# Patient Record
Sex: Female | Born: 1960 | Race: White | Hispanic: No | Marital: Single | Smoking: Current every day smoker
Health system: Southern US, Community
[De-identification: ages and names within clinical notes are randomized; demographics above are authoritative.]

## PROBLEM LIST (undated history)

## (undated) DIAGNOSIS — J449 Chronic obstructive pulmonary disease, unspecified: Secondary | ICD-10-CM

## (undated) DIAGNOSIS — R519 Headache, unspecified: Secondary | ICD-10-CM

## (undated) DIAGNOSIS — R51 Headache: Secondary | ICD-10-CM

## (undated) DIAGNOSIS — I10 Essential (primary) hypertension: Secondary | ICD-10-CM

## (undated) DIAGNOSIS — I219 Acute myocardial infarction, unspecified: Secondary | ICD-10-CM

## (undated) HISTORY — DX: Acute myocardial infarction, unspecified: I21.9

## (undated) HISTORY — PX: CARDIAC SURGERY: SHX584

## (undated) HISTORY — DX: Headache: R51

## (undated) HISTORY — DX: Chronic obstructive pulmonary disease, unspecified: J44.9

## (undated) HISTORY — DX: Essential (primary) hypertension: I10

## (undated) HISTORY — DX: Headache, unspecified: R51.9

---

## 2003-09-25 DIAGNOSIS — I219 Acute myocardial infarction, unspecified: Secondary | ICD-10-CM

## 2003-09-25 HISTORY — DX: Acute myocardial infarction, unspecified: I21.9

## 2004-08-17 ENCOUNTER — Other Ambulatory Visit: Payer: Self-pay

## 2004-08-17 ENCOUNTER — Inpatient Hospital Stay: Payer: Self-pay | Admitting: Cardiology

## 2004-08-18 ENCOUNTER — Other Ambulatory Visit: Payer: Self-pay

## 2004-08-19 ENCOUNTER — Other Ambulatory Visit: Payer: Self-pay

## 2006-09-24 HISTORY — PX: COSMETIC SURGERY: SHX468

## 2007-03-19 ENCOUNTER — Emergency Department: Payer: Self-pay | Admitting: Emergency Medicine

## 2010-03-21 ENCOUNTER — Emergency Department: Payer: Self-pay

## 2013-02-20 LAB — BASIC METABOLIC PANEL
Anion Gap: 6 — ABNORMAL LOW (ref 7–16)
BUN: 4 mg/dL — ABNORMAL LOW (ref 7–18)
Calcium, Total: 8.9 mg/dL (ref 8.5–10.1)
Chloride: 107 mmol/L (ref 98–107)
Co2: 23 mmol/L (ref 21–32)
Creatinine: 0.82 mg/dL (ref 0.60–1.30)
EGFR (African American): >60
Osmolality: 268 (ref 275–301)
Potassium: 3.4 mmol/L — ABNORMAL LOW (ref 3.5–5.1)
Sodium: 136 mmol/L (ref 136–145)

## 2013-02-20 LAB — CBC
HCT: 40.2 % (ref 35.0–47.0)
MCH: 30.6 pg (ref 26.0–34.0)
MCHC: 33.8 g/dL (ref 32.0–36.0)
Platelet: 290 10*3/uL (ref 150–440)
RDW: 13.8 % (ref 11.5–14.5)
WBC: 10 10*3/uL (ref 3.6–11.0)

## 2013-02-20 LAB — CK TOTAL AND CKMB (NOT AT ARMC)
CK, Total: 36 U/L (ref 21–215)
CK, Total: 42 U/L (ref 21–215)
CK, Total: 96 U/L (ref 21–215)
CK-MB: 1.2 ng/mL (ref 0.5–3.6)
CK-MB: <0.5 ng/mL — ABNORMAL LOW (ref 0.5–3.6)

## 2013-02-20 LAB — TROPONIN I: Troponin-I: 0.04 ng/mL

## 2013-02-21 ENCOUNTER — Inpatient Hospital Stay: Payer: Self-pay | Admitting: Internal Medicine

## 2013-02-21 LAB — BASIC METABOLIC PANEL
Anion Gap: 5 — ABNORMAL LOW (ref 7–16)
BUN: 3 mg/dL — ABNORMAL LOW (ref 7–18)
Calcium, Total: 8.6 mg/dL (ref 8.5–10.1)
Chloride: 112 mmol/L — ABNORMAL HIGH (ref 98–107)
Co2: 27 mmol/L (ref 21–32)
Creatinine: 0.6 mg/dL (ref 0.60–1.30)
Glucose: 90 mg/dL (ref 65–99)
Osmolality: 283 (ref 275–301)

## 2013-02-21 LAB — CK TOTAL AND CKMB (NOT AT ARMC): CK-MB: 4.8 ng/mL — ABNORMAL HIGH (ref 0.5–3.6)

## 2013-02-21 LAB — LIPID PANEL
Cholesterol: 167 mg/dL (ref 0–200)
Ldl Cholesterol, Calc: 112 mg/dL — ABNORMAL HIGH (ref 0–100)
VLDL Cholesterol, Calc: 28 mg/dL (ref 5–40)

## 2013-06-06 ENCOUNTER — Emergency Department: Payer: Self-pay | Admitting: Emergency Medicine

## 2015-01-14 NOTE — Discharge Summary (Signed)
PATIENT NAME:  Crystal [name], Crystal [name] MR#:  [id number] DATE OF BIRTH:  [birth year]  DATE OF ADMISSION:  02/20/2013 DATE OF DISCHARGE:  02/22/2013  DISCHARGE DIAGNOSES:   1.  Chest pain secondary to coronary artery disease, status post stenting. The patient had [name] drug-eluting stent performed on proximal third of saphenous vein graft from aorta to second obtuse marginal. Ejection fraction is around 49%.  2.  Hyperlipidemia. 3.  Acute bronchitis. 4.  Tobacco abuse.   CONSULTATION: Cardiology consult with Dr. Darrold JunkerParaschos.   HOSPITAL COURSE: This is [name] 54 year old female patient with tobacco abuse, history of coronary artery disease with CABG in 2005, came in because of:  1.  Chest pain. The patient was admitted to telemetry. The patient's troponins are followed, and EKG was monitored.  Initial vitals are stable. The patient was started on her home medications, aspirin and nitroglycerin. The patient is scheduled to have [name] stress test. Initial troponin was negative. An EKG showed initially no  ST-T changes. The patient continued to have chest pain which was relieved with nitroglycerin.  The patient's second set of troponins are elevated to 0.15.pt with coronary artery disease and CABG, I asked Cardiology to see the patient.  The patient had [name] cardiac cath on  May 30th which showed the patient had stenosis in the saphenous vein graft from aorta to second obtuse marginal around 95% stenosis, so [name] drug-eluting stent  was placed in there and following intervention 0% residual stenosis. The patient's also had proximal LAD 75% stenosis, mid LAD 90% stenosis and also circumflex had 100% stenosis.  Graft to mid LAD  graft was LIMA and graft angiography did not show any evidence of disease. The patient's EF is around 49%. The patient's troponin actually peaked up to 1.10, but after the procedure the patient did not have any chest pain.  She was monitored in the ICU for 24 hours have, and her LDL is around 112, so I started her  on statins.  The patient was hypotensive in the ICU because the patient received trazodone.  She told me that she has some hypotensive episodes with trazodone, and trazodone does not suit her; so, we stopped the trazodone, monitored in the ICU with fluids, and the blood pressure improved by late evening yesterday, and this morning blood pressure is 118/72, heart rate 91.  Yesterday because of low blood pressure her metoprolol and lisinopril were held. Discharge also was held because of hypotension.  Today, blood pressure is good. The patient can go home with beta blockers and ACE inhibitors and statins, aspirin and Plavix.  2.  Chronic obstructive pulmonary disease:  The patient continued to smoke 1 pack per day, has some cough, and the patient's chest x-ray was concerning for infiltrate versus  pneumonia on the left mid lung, so I started her on Rocephin and Zithromax , discharging her with Augmentin, ProAir and Spiriva.  I counseled her about  smoking cessation. The patient is stable for discharge, can follow with Dr. Darrold JunkerParaschos in 1 to 2 weeks.   DISCHARGE MEDICATIONS:  1.  Augmentin 1 tablet p.o. b.i.d. 875/125 for 10 days.  2.  Aspirin 81 mg daily.  3.  Plavix 75 mg p.o. daily. 4.  Lisinopril 5 mg 1/2 tablet daily.  5.  Metoprolol 25 mg tablet, 1 tablet p.o. b.i.d.  6.  Nitroglycerin sublingual as needed for chest pain p.r.n.  7.  Simvastatin 10 mg p.o. daily.  8.  Spiriva 18 mcg inhalation 1 spray daily.  The patient has no primary doctor, but she is advised to take medications and follow up with Dr. Darrold Junker.   TIME SPENT ON DISCHARGE PREPARATION: More than 30 minutes.    ____________________________ Katha Hamming, MD sk:cb D: 02/22/2013 08:07:02 ET T: 02/22/2013 16:10:16 ET JOB#: [id number]  cc: Katha Hamming, MD, <Dictator> Katha Hamming MD ELECTRONICALLY SIGNED 02/22/2013 20:26
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
## 2015-04-14 ENCOUNTER — Other Ambulatory Visit: Payer: Self-pay

## 2015-04-19 ENCOUNTER — Other Ambulatory Visit: Payer: Self-pay

## 2015-04-21 ENCOUNTER — Ambulatory Visit: Payer: Self-pay

## 2015-04-21 LAB — BASIC METABOLIC PANEL
BUN: 4 mg/dL (ref 4–21)
CREATININE: 0.7 mg/dL (ref 0.5–1.1)
Glucose: 104 mg/dL
Potassium: 5 mmol/L (ref 3.4–5.3)
SODIUM: 141 mmol/L (ref 137–147)

## 2015-04-21 LAB — LIPID PANEL
Cholesterol: 212 mg/dL — AB (ref 0–200)
HDL: 55 mg/dL (ref 35–70)
LDL Cholesterol: 128 mg/dL
Triglycerides: 147 mg/dL (ref 40–160)

## 2015-04-21 LAB — CBC AND DIFFERENTIAL
HEMATOCRIT: 42 % (ref 36–46)
Hemoglobin: 14.6 g/dL (ref 12.0–16.0)
Platelets: 342 10*3/uL (ref 150–399)
WBC: 9.1 10^3/mL

## 2015-04-21 LAB — HEPATIC FUNCTION PANEL
ALK PHOS: 55 U/L (ref 25–125)
ALT: 16 U/L (ref 7–35)
AST: 17 U/L (ref 13–35)
Bilirubin, Total: 0.3 mg/dL

## 2015-04-21 LAB — HEMOGLOBIN A1C: HEMOGLOBIN A1C: 5.5 % (ref 4.0–6.0)

## 2015-05-14 DIAGNOSIS — J441 Chronic obstructive pulmonary disease with (acute) exacerbation: Secondary | ICD-10-CM

## 2015-05-14 DIAGNOSIS — Z720 Tobacco use: Secondary | ICD-10-CM

## 2015-05-14 DIAGNOSIS — I10 Essential (primary) hypertension: Secondary | ICD-10-CM

## 2015-05-14 DIAGNOSIS — I25709 Atherosclerosis of coronary artery bypass graft(s), unspecified, with unspecified angina pectoris: Secondary | ICD-10-CM

## 2015-05-14 DIAGNOSIS — I25810 Atherosclerosis of coronary artery bypass graft(s) without angina pectoris: Secondary | ICD-10-CM | POA: Insufficient documentation

## 2015-05-24 ENCOUNTER — Ambulatory Visit: Payer: Self-pay

## 2015-11-10 ENCOUNTER — Ambulatory Visit: Payer: Self-pay

## 2015-11-10 DIAGNOSIS — J441 Chronic obstructive pulmonary disease with (acute) exacerbation: Secondary | ICD-10-CM | POA: Insufficient documentation

## 2015-11-10 DIAGNOSIS — I10 Essential (primary) hypertension: Secondary | ICD-10-CM | POA: Insufficient documentation

## 2015-11-30 ENCOUNTER — Ambulatory Visit: Payer: Self-pay | Admitting: Internal Medicine

## 2015-11-30 ENCOUNTER — Ambulatory Visit: Payer: Self-pay | Admitting: Ophthalmology

## 2018-07-02 ENCOUNTER — Ambulatory Visit: Payer: Self-pay

## 2018-09-10 ENCOUNTER — Ambulatory Visit: Payer: Self-pay

## 2019-01-07 ENCOUNTER — Ambulatory Visit: Payer: Self-pay

## 2019-12-05 ENCOUNTER — Inpatient Hospital Stay
Admission: EM | Admit: 2019-12-05 | Discharge: 2019-12-08 | DRG: 247 | Disposition: A | Discharge status: Home / Self Care | Payer: Self-pay | Attending: Internal Medicine | Admitting: Internal Medicine

## 2019-12-05 ENCOUNTER — Inpatient Hospital Stay
Admit: 2019-12-05 | Discharge: 2019-12-05 | Disposition: A | Discharge status: Home / Self Care | Payer: Self-pay | Attending: Family Medicine | Admitting: Family Medicine

## 2019-12-05 ENCOUNTER — Emergency Department: Payer: Self-pay

## 2019-12-05 ENCOUNTER — Other Ambulatory Visit: Payer: Self-pay

## 2019-12-05 ENCOUNTER — Encounter: Payer: Self-pay | Admitting: Emergency Medicine

## 2019-12-05 DIAGNOSIS — Z955 Presence of coronary angioplasty implant and graft: Secondary | ICD-10-CM

## 2019-12-05 DIAGNOSIS — I252 Old myocardial infarction: Secondary | ICD-10-CM

## 2019-12-05 DIAGNOSIS — Z951 Presence of aortocoronary bypass graft: Secondary | ICD-10-CM

## 2019-12-05 DIAGNOSIS — Z20822 Contact with and (suspected) exposure to covid-19: Secondary | ICD-10-CM | POA: Diagnosis present

## 2019-12-05 DIAGNOSIS — Z7902 Long term (current) use of antithrombotics/antiplatelets: Secondary | ICD-10-CM

## 2019-12-05 DIAGNOSIS — Z885 Allergy status to narcotic agent status: Secondary | ICD-10-CM

## 2019-12-05 DIAGNOSIS — Z801 Family history of malignant neoplasm of trachea, bronchus and lung: Secondary | ICD-10-CM

## 2019-12-05 DIAGNOSIS — Z7951 Long term (current) use of inhaled steroids: Secondary | ICD-10-CM

## 2019-12-05 DIAGNOSIS — J449 Chronic obstructive pulmonary disease, unspecified: Secondary | ICD-10-CM | POA: Diagnosis present

## 2019-12-05 DIAGNOSIS — E785 Hyperlipidemia, unspecified: Secondary | ICD-10-CM | POA: Diagnosis present

## 2019-12-05 DIAGNOSIS — I5022 Chronic systolic (congestive) heart failure: Secondary | ICD-10-CM | POA: Diagnosis present

## 2019-12-05 DIAGNOSIS — I2510 Atherosclerotic heart disease of native coronary artery without angina pectoris: Secondary | ICD-10-CM | POA: Diagnosis present

## 2019-12-05 DIAGNOSIS — Z7982 Long term (current) use of aspirin: Secondary | ICD-10-CM

## 2019-12-05 DIAGNOSIS — Z8249 Family history of ischemic heart disease and other diseases of the circulatory system: Secondary | ICD-10-CM

## 2019-12-05 DIAGNOSIS — I110 Hypertensive heart disease with heart failure: Secondary | ICD-10-CM | POA: Diagnosis present

## 2019-12-05 DIAGNOSIS — F17210 Nicotine dependence, cigarettes, uncomplicated: Secondary | ICD-10-CM | POA: Diagnosis present

## 2019-12-05 DIAGNOSIS — I10 Essential (primary) hypertension: Secondary | ICD-10-CM

## 2019-12-05 DIAGNOSIS — Z720 Tobacco use: Secondary | ICD-10-CM

## 2019-12-05 DIAGNOSIS — Z825 Family history of asthma and other chronic lower respiratory diseases: Secondary | ICD-10-CM

## 2019-12-05 DIAGNOSIS — I214 Non-ST elevation (NSTEMI) myocardial infarction: Principal | ICD-10-CM | POA: Diagnosis present

## 2019-12-05 DIAGNOSIS — Z79899 Other long term (current) drug therapy: Secondary | ICD-10-CM

## 2019-12-05 LAB — COMPREHENSIVE METABOLIC PANEL
ALT: 24 U/L (ref 0–44)
AST: 23 U/L (ref 15–41)
Albumin: 4.4 g/dL (ref 3.5–5.0)
Alkaline Phosphatase: 62 U/L (ref 38–126)
Anion gap: 10 (ref 5–15)
BUN: 7 mg/dL (ref 6–20)
CO2: 26 mmol/L (ref 22–32)
Calcium: 9.7 mg/dL (ref 8.9–10.3)
Chloride: 99 mmol/L (ref 98–111)
Creatinine, Ser: 0.67 mg/dL (ref 0.44–1.00)
GFR calc Af Amer: >60 mL/min (ref >60)
GFR calc non Af Amer: >60 mL/min (ref >60)
Glucose, Bld: 157 mg/dL — ABNORMAL HIGH (ref 70–99)
Potassium: 3.5 mmol/L (ref 3.5–5.1)
Sodium: 135 mmol/L (ref 135–145)
Total Bilirubin: 0.3 mg/dL (ref 0.3–1.2)
Total Protein: 7.7 g/dL (ref 6.5–8.1)

## 2019-12-05 LAB — APTT: aPTT: 26 seconds (ref 24–36)

## 2019-12-05 LAB — CBC WITH DIFFERENTIAL/PLATELET
Abs Immature Granulocytes: 0.02 10*3/uL (ref 0.00–0.07)
Basophils Absolute: 0.1 10*3/uL (ref 0.0–0.1)
Basophils Relative: 1 %
Eosinophils Absolute: 0.4 10*3/uL (ref 0.0–0.5)
Eosinophils Relative: 5 %
HCT: 38.9 % (ref 36.0–46.0)
Hemoglobin: 13.1 g/dL (ref 12.0–15.0)
Immature Granulocytes: 0 %
Lymphocytes Relative: 34 %
Lymphs Abs: 2.4 10*3/uL (ref 0.7–4.0)
MCH: 29.4 pg (ref 26.0–34.0)
MCHC: 33.7 g/dL (ref 30.0–36.0)
MCV: 87.4 fL (ref 80.0–100.0)
Monocytes Absolute: 0.5 10*3/uL (ref 0.1–1.0)
Monocytes Relative: 8 %
Neutro Abs: 3.7 10*3/uL (ref 1.7–7.7)
Neutrophils Relative %: 52 %
Platelets: 359 10*3/uL (ref 150–400)
RBC: 4.45 MIL/uL (ref 3.87–5.11)
RDW: 13.2 % (ref 11.5–15.5)
WBC: 7.1 10*3/uL (ref 4.0–10.5)
nRBC: 0 % (ref 0.0–0.2)

## 2019-12-05 LAB — LIPID PANEL
Cholesterol: 167 mg/dL (ref 0–200)
HDL: 43 mg/dL (ref >40)
LDL Cholesterol: 90 mg/dL (ref 0–99)
Total CHOL/HDL Ratio: 3.9 RATIO
Triglycerides: 171 mg/dL — ABNORMAL HIGH (ref <150)
VLDL: 34 mg/dL (ref 0–40)

## 2019-12-05 LAB — RESPIRATORY PANEL BY RT PCR (FLU A&B, COVID)
Influenza A by PCR: NEGATIVE
Influenza B by PCR: NEGATIVE
SARS Coronavirus 2 by RT PCR: NEGATIVE

## 2019-12-05 LAB — PROTIME-INR
INR: 0.9 (ref 0.8–1.2)
Prothrombin Time: 11.5 seconds (ref 11.4–15.2)

## 2019-12-05 LAB — HEPARIN LEVEL (UNFRACTIONATED)
Heparin Unfractionated: 1.15 IU/mL — ABNORMAL HIGH (ref 0.30–0.70)
Heparin Unfractionated: 0.51 IU/mL (ref 0.30–0.70)

## 2019-12-05 LAB — TROPONIN I (HIGH SENSITIVITY)
Troponin I (High Sensitivity): 110 ng/L (ref <18)
Troponin I (High Sensitivity): 200 ng/L (ref <18)
Troponin I (High Sensitivity): 3557 ng/L (ref <18)

## 2019-12-05 LAB — GLUCOSE, CAPILLARY: Glucose-Capillary: 124 mg/dL — ABNORMAL HIGH (ref 70–99)

## 2019-12-05 LAB — HIV ANTIBODY (ROUTINE TESTING W REFLEX): HIV Screen 4th Generation wRfx: NONREACTIVE

## 2019-12-05 MED ORDER — FENTANYL CITRATE (PF) 100 MCG/2ML IJ SOLN
50.0000 ug | Freq: Once | INTRAMUSCULAR | Status: AC
  Administered 2019-12-05: 50 ug via INTRAVENOUS
  Filled 2019-12-05: qty 2

## 2019-12-05 MED ORDER — PERFLUTREN LIPID MICROSPHERE
1.0000 mL | INTRAVENOUS | Status: AC | PRN
  Administered 2019-12-05: 3 mL via INTRAVENOUS
  Filled 2019-12-05: qty 10

## 2019-12-05 MED ORDER — ATORVASTATIN CALCIUM 20 MG PO TABS
40.0000 mg | ORAL_TABLET | Freq: Every day | ORAL | Status: DC
  Administered 2019-12-05 – 2019-12-07 (×3): 40 mg via ORAL
  Filled 2019-12-05 (×4): qty 2

## 2019-12-05 MED ORDER — ALPRAZOLAM 0.25 MG PO TABS: 0.2500 mg | ORAL_TABLET | Freq: Two times a day (BID) | ORAL | Status: DC | PRN

## 2019-12-05 MED ORDER — ONDANSETRON HCL 4 MG/2ML IJ SOLN: 4.0000 mg | Freq: Four times a day (QID) | INTRAMUSCULAR | Status: DC | PRN

## 2019-12-05 MED ORDER — LIDOCAINE VISCOUS HCL 2 % MT SOLN
15.0000 mL | Freq: Once | OROMUCOSAL | Status: AC
  Administered 2019-12-05: 15 mL via ORAL
  Filled 2019-12-05: qty 15

## 2019-12-05 MED ORDER — ASPIRIN EC 81 MG PO TBEC
81.0000 mg | DELAYED_RELEASE_TABLET | Freq: Every day | ORAL | Status: DC
  Administered 2019-12-05 – 2019-12-08 (×3): 81 mg via ORAL
  Filled 2019-12-05 (×3): qty 1

## 2019-12-05 MED ORDER — MORPHINE SULFATE (PF) 2 MG/ML IV SOLN
2.0000 mg | INTRAVENOUS | Status: DC | PRN
  Administered 2019-12-05 (×2): 2 mg via INTRAVENOUS
  Filled 2019-12-05 (×3): qty 1

## 2019-12-05 MED ORDER — ACETAMINOPHEN 325 MG PO TABS: 650.0000 mg | ORAL_TABLET | ORAL | Status: DC | PRN

## 2019-12-05 MED ORDER — IOHEXOL 350 MG/ML SOLN
75.0000 mL | Freq: Once | INTRAVENOUS | Status: AC | PRN
  Administered 2019-12-05: 75 mL via INTRAVENOUS

## 2019-12-05 MED ORDER — NITROGLYCERIN 0.4 MG SL SUBL
0.4000 mg | SUBLINGUAL_TABLET | SUBLINGUAL | Status: DC | PRN
  Administered 2019-12-05: 0.4 mg via SUBLINGUAL
  Filled 2019-12-05: qty 1

## 2019-12-05 MED ORDER — NITROGLYCERIN IN D5W 200-5 MCG/ML-% IV SOLN
0.0000 ug/min | INTRAVENOUS | Status: DC
  Administered 2019-12-05: 50 ug/min via INTRAVENOUS
  Filled 2019-12-05: qty 250

## 2019-12-05 MED ORDER — CHLORHEXIDINE GLUCONATE CLOTH 2 % EX PADS
6.0000 | MEDICATED_PAD | Freq: Every day | CUTANEOUS | Status: DC
  Administered 2019-12-05 – 2019-12-07 (×3): 6 via TOPICAL

## 2019-12-05 MED ORDER — POTASSIUM CHLORIDE 20 MEQ PO PACK
40.0000 meq | PACK | Freq: Once | ORAL | Status: AC
  Administered 2019-12-05: 40 meq via ORAL
  Filled 2019-12-05: qty 2

## 2019-12-05 MED ORDER — METOPROLOL TARTRATE 25 MG PO TABS: 12.5000 mg | ORAL_TABLET | Freq: Two times a day (BID) | ORAL | Status: DC

## 2019-12-05 MED ORDER — CLOPIDOGREL BISULFATE 75 MG PO TABS
75.0000 mg | ORAL_TABLET | Freq: Every day | ORAL | Status: DC
  Administered 2019-12-05 – 2019-12-06 (×2): 75 mg via ORAL
  Filled 2019-12-05 (×2): qty 1

## 2019-12-05 MED ORDER — SODIUM CHLORIDE 0.9 % IV BOLUS
1000.0000 mL | Freq: Once | INTRAVENOUS | Status: AC
  Administered 2019-12-05: 1000 mL via INTRAVENOUS

## 2019-12-05 MED ORDER — HEPARIN (PORCINE) 25000 UT/250ML-% IV SOLN
800.0000 [IU]/h | INTRAVENOUS | Status: DC
  Administered 2019-12-05: 800 [IU]/h via INTRAVENOUS
  Filled 2019-12-05: qty 250

## 2019-12-05 MED ORDER — METOPROLOL SUCCINATE ER 25 MG PO TB24
25.0000 mg | ORAL_TABLET | Freq: Two times a day (BID) | ORAL | Status: DC
  Administered 2019-12-05 – 2019-12-08 (×6): 25 mg via ORAL
  Filled 2019-12-05 (×7): qty 1

## 2019-12-05 MED ORDER — HEPARIN (PORCINE) 25000 UT/250ML-% IV SOLN
700.0000 [IU]/h | INTRAVENOUS | Status: DC
  Administered 2019-12-06: 700 [IU]/h via INTRAVENOUS
  Filled 2019-12-05: qty 250

## 2019-12-05 MED ORDER — ALUM & MAG HYDROXIDE-SIMETH 200-200-20 MG/5ML PO SUSP
30.0000 mL | Freq: Once | ORAL | Status: AC
  Administered 2019-12-05: 30 mL via ORAL
  Filled 2019-12-05: qty 30

## 2019-12-05 MED ORDER — HEPARIN BOLUS VIA INFUSION
3400.0000 [IU] | Freq: Once | INTRAVENOUS | Status: AC
  Administered 2019-12-05: 3400 [IU] via INTRAVENOUS
  Filled 2019-12-05: qty 3400

## 2019-12-05 MED ORDER — LISINOPRIL 5 MG PO TABS
2.5000 mg | ORAL_TABLET | Freq: Every day | ORAL | Status: DC
  Administered 2019-12-05 – 2019-12-08 (×3): 2.5 mg via ORAL
  Filled 2019-12-05 (×3): qty 1

## 2019-12-05 MED ORDER — ISOSORBIDE MONONITRATE ER 30 MG PO TB24
30.0000 mg | ORAL_TABLET | Freq: Every day | ORAL | Status: DC
  Administered 2019-12-05 – 2019-12-08 (×3): 30 mg via ORAL
  Filled 2019-12-05 (×3): qty 1

## 2019-12-05 MED ORDER — SODIUM CHLORIDE 0.9 % IV SOLN
INTRAVENOUS | Status: DC
  Administered 2019-12-06: 13:00:00 100 mL/h via INTRAVENOUS

## 2019-12-05 MED ORDER — ALBUTEROL SULFATE (2.5 MG/3ML) 0.083% IN NEBU: 2.5000 mg | INHALATION_SOLUTION | Freq: Four times a day (QID) | RESPIRATORY_TRACT | Status: DC | PRN

## 2019-12-05 MED ORDER — ZOLPIDEM TARTRATE 5 MG PO TABS: 5.0000 mg | ORAL_TABLET | Freq: Every evening | ORAL | Status: DC | PRN

## 2019-12-05 NOTE — Progress Notes (Signed)
ANTICOAGULATION CONSULT NOTE - Initial Consult  Pharmacy Consult for heparin Indication: chest pain/ACS  Allergies  Allergen Reactions  . Trazodone And Nefazodone Palpitations    Patient Measurements: Height: 5' (152.4 cm) Weight: 125 lb 7.1 oz (56.9 kg) IBW/kg (Calculated) : 45.5 Heparin Dosing Weight: 56.2 kg  Vital Signs: Temp: 97.8 F (36.6 C) (03/13 0746) Temp Source: Oral (03/13 0746) BP: 128/75 (03/13 1000) Pulse Rate: 105 (03/13 1023)  Labs: Recent Labs    12/05/19 0142 12/05/19 0329 12/05/19 1048  HGB 13.1  --   --   HCT 38.9  --   --   PLT 359  --   --   APTT 26  --   --   LABPROT 11.5  --   --   INR 0.9  --   --   HEPARINUNFRC  --   --  1.15*  CREATININE 0.67  --   --   TROPONINIHS 110* 200*  --     Estimated Creatinine Clearance: 61.4 mL/min (by C-G formula based on SCr of 0.67 mg/dL).   Medical History: Past Medical History:  Diagnosis Date  . COPD (chronic obstructive pulmonary disease) (HCC)   . Headache   . Hypertension   . Myocardial infarction [facility]) 2005   Bypass 4X stint 2014/2015    Medications:  Scheduled:  . aspirin EC  81 mg Oral Daily  . atorvastatin  40 mg Oral QHS  . clopidogrel  75 mg Oral Daily  . isosorbide mononitrate  30 mg Oral Daily  . lisinopril  2.5 mg Oral Daily  . metoprolol succinate  25 mg Oral BID    Assessment: Patient admitted x CP w/ h/o COPD, CAD s/p MI w/ x 4 stents, HTN, patient w/ initial trop of 110 >> 200, EKG showing slight ST elevation in leads V1, V3, aVR and depression in lead V5. Patient baseline CBC WNL, INR WNL, baseline aPTT pending. Patient does not appear to be on anticoagulation PTA. Patient is being started on heparin drip for management of NSTEMI.  Will bolus heparin 3400 units IV x 1 Will start heparin rate at 800 units/hr. Will check anti-Xa at 1100.  Goal of Therapy:  Heparin level 0.3-0.7 units/ml Monitor platelets by anticoagulation protocol: Yes   Plan:  3/13 @ 1048 HL= 1.15  supratherapeutic. Will hold heparin drip x 1 hour and decrease to 700 units/hr. Will check HL 6 hrs after restart of drip Will monitor daily CBC's and adjust per anti-Xa levels.  Bari Mantis PharmD Clinical Pharmacist 12/05/2019

## 2019-12-05 NOTE — Progress Notes (Signed)
  PROGRESS NOTE    Crystal [name]  QMK:[id number] DOB: June 21, 1961 DOA: 12/05/2019  PCP: [facility]    LOS - 0    Patient admitted overnight with chest pain and elevated troponin, consistent with NSTEMI.  Interval subjective: Patient reports he still has some chest pressure.  States morphine helps a little.  Denies shortness of breath, nausea, diaphoresis or other acute complaints.  She asks about plan, advised cardiology awaiting echo results to determine neck steps.  She expressed understanding.  Exam: No acute distress, hoarse voice, scattered scabs on extremities.  Heart sounds normal S1/S2, RRR, lungs clear bilaterally with mild intermittent wheeze on the right posteriorly, no peripheral edema.  I have reviewed the full H&P by Dr. Arville Care in detail, and I agree with the assessment and plan as outlined therein. In addition: --Continue heparin drip, aspirin and Plavix, beta-blocker, statin and ACE inhibitor  --Follow-up echo report --Stop nitro drip due to low blood pressures --Next steps per cardiology  No Charge    Pennie Banter, DO [facility]   If 7PM-7AM, please contact night-coverage [email or web address] 12/05/2019, 9:15 AM

## 2019-12-05 NOTE — ED Notes (Signed)
Pt's son Francee Piccolo was notified

## 2019-12-05 NOTE — Progress Notes (Signed)
ANTICOAGULATION CONSULT NOTE - Initial Consult  Pharmacy Consult for heparin Indication: chest pain/ACS  Allergies  Allergen Reactions  . Trazodone And Nefazodone Palpitations    Patient Measurements: Height: 5' (152.4 cm) Weight: 124 lb (56.2 kg) IBW/kg (Calculated) : 45.5 Heparin Dosing Weight: 56.2 kg  Vital Signs: Temp: 98.3 F (36.8 C) (03/13 0148) Temp Source: Oral (03/13 0148) BP: 104/75 (03/13 0328) Pulse Rate: 111 (03/13 0328)  Labs: Recent Labs    12/05/19 0142 12/05/19 0329  HGB 13.1  --   HCT 38.9  --   PLT 359  --   LABPROT 11.5  --   INR 0.9  --   CREATININE 0.67  --   TROPONINIHS 110* 200*    Estimated Creatinine Clearance: 61 mL/min (by C-G formula based on SCr of 0.67 mg/dL).   Medical History: Past Medical History:  Diagnosis Date  . COPD (chronic obstructive pulmonary disease) (HCC)   . Headache   . Hypertension   . Myocardial infarction [facility]) 2005   Bypass 4X stint 2014/2015    Medications:  Scheduled:  . heparin  3,400 Units Intravenous Once    Assessment: Patient admitted x CP w/ h/o COPD, CAD s/p MI w/ x 4 stents, HTN, patient w/ initial trop of 110 >> 200, EKG showing slight ST elevation in leads V1, V3, aVR and depression in lead V5. Patient baseline CBC WNL, INR WNL, baseline aPTT pending. Patient does not appear to be on anticoagulation PTA. Patient is being started on heparin drip for management of NSTEMI.  Goal of Therapy:  Heparin level 0.3-0.7 units/ml Monitor platelets by anticoagulation protocol: Yes   Plan:  Will bolus heparin 3400 units IV x 1 Will start heparin rate at 800 units/hr. Will check anti-Xa at 1100. Will monitor daily CBC's and adjust per anti-Xa levels.  Thomasene Ripple, PharmD, BCPS Clinical Pharmacist 12/05/2019,4:46 AM

## 2019-12-05 NOTE — ED Triage Notes (Signed)
Pt from home to ED via [facility] for CP. Pt st pain is tight radiating down left arm & has been coming on at night the strongest for the last few days./ Pt reports taking 3 SL nitro at home prior to EMS arrival with no relief. Pain 6/10. Pt was given 1 additional SL nitro and 324 ASA by EMS.   Pt reports feeling SHOB/ Diaphoretic & Lightheaded when the CP "hits". Intermittent at times but progressively worsening. Pt has extensive cardiac hx of quadruple bypass and 3 stents replaced.,
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
## 2019-12-05 NOTE — ED Notes (Signed)
Date and time results received: 12/05/19 2:37 AM    Test: Troponin  Critical Value: 110  Name of Provider Notified: Dr. Scotty Court

## 2019-12-05 NOTE — ED Notes (Signed)
Pt back from CT at this time 

## 2019-12-05 NOTE — Progress Notes (Signed)
ANTICOAGULATION CONSULT NOTE - Initial Consult  Pharmacy Consult for heparin Indication: chest pain/ACS  Allergies  Allergen Reactions  . Trazodone And Nefazodone Palpitations    Patient Measurements: Height: 5' (152.4 cm) Weight: 125 lb 7.1 oz (56.9 kg) IBW/kg (Calculated) : 45.5 Heparin Dosing Weight: 56.2 kg  Vital Signs: Temp: 98.1 F (36.7 C) (03/13 2000) Temp Source: Oral (03/13 2000) BP: 101/90 (03/13 2000) Pulse Rate: 80 (03/13 2000)  Labs: Recent Labs    12/05/19 0142 12/05/19 0329 12/05/19 1048 12/05/19 1950  HGB 13.1  --   --   --   HCT 38.9  --   --   --   PLT 359  --   --   --   APTT 26  --   --   --   LABPROT 11.5  --   --   --   INR 0.9  --   --   --   HEPARINUNFRC  --   --  1.15* 0.51  CREATININE 0.67  --   --   --   TROPONINIHS 110* 200*  --  3,557*    Estimated Creatinine Clearance: 61.4 mL/min (by C-G formula based on SCr of 0.67 mg/dL).   Medical History: Past Medical History:  Diagnosis Date  . COPD (chronic obstructive pulmonary disease) (HCC)   . Headache   . Hypertension   . Myocardial infarction [facility]) 2005   Bypass 4X stint 2014/2015    Medications:  Scheduled:  . aspirin EC  81 mg Oral Daily  . atorvastatin  40 mg Oral QHS  . Chlorhexidine Gluconate Cloth  6 each Topical Daily  . clopidogrel  75 mg Oral Daily  . isosorbide mononitrate  30 mg Oral Daily  . lisinopril  2.5 mg Oral Daily  . metoprolol succinate  25 mg Oral BID    Assessment: Patient admitted x CP w/ h/o COPD, CAD s/p MI w/ x 4 stents, HTN, patient w/ initial trop of 110 >> 200, EKG showing slight ST elevation in leads V1, V3, aVR and depression in lead V5. Patient baseline CBC WNL, INR WNL, baseline aPTT pending. Patient does not appear to be on anticoagulation PTA. Patient is being started on heparin drip for management of NSTEMI.  Will bolus heparin 3400 units IV x 1 Will start heparin rate at 800 units/hr. Will check anti-Xa at 1100.  3/13 @ 1048 HL=  1.15 supratherapeutic. Will hold heparin drip x 1 hour and decrease to 700 units/hr.  Goal of Therapy:  Heparin level 0.3-0.7 units/ml Monitor platelets by anticoagulation protocol: Yes   Plan:  3/13 @ 1950: HL 0.51. therapeutic. Will recheck confirmatory HL in 6 hours.  Will monitor daily CBC's and adjust per anti-Xa levels.  Bettey Costa, PharmD Clinical Pharmacist 12/05/2019 8:44 PM

## 2019-12-05 NOTE — ED Notes (Signed)
Pt taken to CT at this time.
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
## 2019-12-05 NOTE — ED Notes (Signed)
Pt up to bathroom at this time

## 2019-12-06 LAB — CBC
HCT: 37.8 % (ref 36.0–46.0)
Hemoglobin: 12.6 g/dL (ref 12.0–15.0)
MCH: 29.4 pg (ref 26.0–34.0)
MCHC: 33.3 g/dL (ref 30.0–36.0)
MCV: 88.3 fL (ref 80.0–100.0)
Platelets: 324 10*3/uL (ref 150–400)
RBC: 4.28 MIL/uL (ref 3.87–5.11)
RDW: 13.6 % (ref 11.5–15.5)
WBC: 7.9 10*3/uL (ref 4.0–10.5)
nRBC: 0 % (ref 0.0–0.2)

## 2019-12-06 LAB — BASIC METABOLIC PANEL
Anion gap: 7 (ref 5–15)
BUN: 6 mg/dL (ref 6–20)
CO2: 26 mmol/L (ref 22–32)
Calcium: 9.1 mg/dL (ref 8.9–10.3)
Chloride: 107 mmol/L (ref 98–111)
Creatinine, Ser: 0.65 mg/dL (ref 0.44–1.00)
GFR calc Af Amer: >60 mL/min (ref >60)
GFR calc non Af Amer: >60 mL/min (ref >60)
Glucose, Bld: 95 mg/dL (ref 70–99)
Potassium: 3.8 mmol/L (ref 3.5–5.1)
Sodium: 140 mmol/L (ref 135–145)

## 2019-12-06 LAB — HEPARIN LEVEL (UNFRACTIONATED): Heparin Unfractionated: 0.6 IU/mL (ref 0.30–0.70)

## 2019-12-06 LAB — ECHOCARDIOGRAM COMPLETE
Height: 60 in
Weight: 2007.07 oz

## 2019-12-06 LAB — TROPONIN I (HIGH SENSITIVITY): Troponin I (High Sensitivity): 2142 ng/L (ref <18)

## 2019-12-06 MED ORDER — SODIUM CHLORIDE 0.9% FLUSH
3.0000 mL | Freq: Two times a day (BID) | INTRAVENOUS | Status: DC
  Administered 2019-12-06 – 2019-12-08 (×3): 3 mL via INTRAVENOUS

## 2019-12-06 NOTE — Progress Notes (Signed)
ANTICOAGULATION CONSULT NOTE - Initial Consult  Pharmacy Consult for heparin Indication: chest pain/ACS  Allergies  Allergen Reactions  . Trazodone And Nefazodone Palpitations    Patient Measurements: Height: 5' (152.4 cm) Weight: 125 lb 7.1 oz (56.9 kg) IBW/kg (Calculated) : 45.5 Heparin Dosing Weight: 56.2 kg  Vital Signs: Temp: 98.1 F (36.7 C) (03/14 0000) Temp Source: Oral (03/14 0000) BP: 107/94 (03/14 0100) Pulse Rate: 88 (03/14 0100)  Labs: Recent Labs    12/05/19 0142 12/05/19 0329 12/05/19 1048 12/05/19 1950 12/06/19 0320  HGB 13.1  --   --   --   --   HCT 38.9  --   --   --   --   PLT 359  --   --   --   --   APTT 26  --   --   --   --   LABPROT 11.5  --   --   --   --   INR 0.9  --   --   --   --   HEPARINUNFRC  --   --  1.15* 0.51 0.60  CREATININE 0.67  --   --   --  0.65  TROPONINIHS 110* 200*  --  3,557*  --     Estimated Creatinine Clearance: 61.4 mL/min (by C-G formula based on SCr of 0.65 mg/dL).   Medical History: Past Medical History:  Diagnosis Date  . COPD (chronic obstructive pulmonary disease) ([facility])   . Headache   . Hypertension   . Myocardial infarction [facility]) 2005   Bypass 4X stint 2014/2015    Medications:  Scheduled:  . aspirin EC  81 mg Oral Daily  . atorvastatin  40 mg Oral QHS  . Chlorhexidine Gluconate Cloth  6 each Topical Daily  . clopidogrel  75 mg Oral Daily  . isosorbide mononitrate  30 mg Oral Daily  . lisinopril  2.5 mg Oral Daily  . metoprolol succinate  25 mg Oral BID    Assessment: Patient admitted x CP w/ h/o COPD, CAD s/p MI w/ x 4 stents, HTN, patient w/ initial trop of 110 >> 200, EKG showing slight ST elevation in leads V1, V3, aVR and depression in lead V5. Patient baseline CBC WNL, INR WNL, baseline aPTT pending. Patient does not appear to be on anticoagulation PTA. Patient is being started on heparin drip for management of NSTEMI.  Will bolus heparin 3400 units IV x 1 Will start heparin rate at 800  units/hr. Will check anti-Xa at 1100.  3/13 @ 1048 HL= 1.15 supratherapeutic. Will hold heparin drip x 1 hour and decrease to 700 units/hr.  Goal of Therapy:  Heparin level 0.3-0.7 units/ml Monitor platelets by anticoagulation protocol: Yes   Plan:  03/14 @ 0320 HL 0.60 therapeutic. Will continue current rate and will recheck HL w/ am labs, and monitor CBC w/ this am labs and continue to monitor.  Thomasene Ripple, PharmD Clinical Pharmacist 12/06/2019 4:09 AM

## 2019-12-06 NOTE — Progress Notes (Signed)
[facility] Cardiology [facility] Encounter Note  Patient: Crystal [name] / Admit Date: 12/05/2019 / Date of Encounter: 12/06/2019, 8:04 AM   Subjective: Patient feeling much better today.  No evidence of significant chest discomfort or congestive heart failure.  Patient's telemetry is stable.  Elevation of troponin at 3557 consistent with non-ST elevation myocardial infarction. Echocardiogram showing mild apical akinesis consistent with myocardial infarction with ejection fraction of 50%  Review of Systems: Positive for: None Negative for: Vision change, hearing change, syncope, dizziness, nausea, vomiting,diarrhea, bloody stool, stomach pain, cough, congestion, diaphoresis, urinary frequency, urinary pain,skin lesions, skin rashes Others previously listed  Objective: Telemetry: Normal sinus rhythm Physical Exam: Blood pressure 100/75, pulse 72, temperature 98.1 F (36.7 C), temperature source Oral, resp. rate 18, height 5' (1.524 m), weight 56.9 kg, SpO2 93 %. Body mass index is 24.5 kg/m. General: Well developed, well nourished, in no acute distress. Head: Normocephalic, atraumatic, sclera non-icteric, no xanthomas, nares are without discharge. Neck: No apparent masses Lungs: Normal respirations with no wheezes, no rhonchi, no rales , no crackles   Heart: Regular rate and rhythm, normal S1 S2, no murmur, no rub, no gallop, PMI is normal size and placement, carotid upstroke normal without bruit, jugular venous pressure normal Abdomen: Soft, non-tender, non-distended with normoactive bowel sounds. No hepatosplenomegaly. Abdominal aorta is normal size without bruit Extremities: No edema, no clubbing, no cyanosis, no ulcers,  Peripheral: 2+ radial, 2+ femoral, 2+ dorsal pedal pulses Neuro: Alert and oriented. Moves all extremities spontaneously. Psych:  Responds to questions appropriately with a normal affect.   Intake/Output Summary (Last 24 hours) at 12/06/2019 0804 Last data filed at  12/05/2019 2331 Gross per 24 hour  Intake 1203.8 ml  Output 2450 ml  Net -1246.2 ml    Inpatient Medications:  . aspirin EC  81 mg Oral Daily  . atorvastatin  40 mg Oral QHS  . Chlorhexidine Gluconate Cloth  6 each Topical Daily  . clopidogrel  75 mg Oral Daily  . isosorbide mononitrate  30 mg Oral Daily  . lisinopril  2.5 mg Oral Daily  . metoprolol succinate  25 mg Oral BID   Infusions:  . sodium chloride 100 mL/hr at 12/06/19 0315  . heparin 700 Units/hr (12/05/19 1420)  . nitroGLYCERIN 60 mcg/min (12/05/19 0513)    Labs: Recent Labs    12/05/19 0142 12/06/19 0320  NA 135 140  K 3.5 3.8  CL 99 107  CO2 26 26  GLUCOSE 157* 95  BUN 7 6  CREATININE 0.67 0.65  CALCIUM 9.7 9.1   Recent Labs    12/05/19 0142  AST 23  ALT 24  ALKPHOS 62  BILITOT 0.3  PROT 7.7  ALBUMIN 4.4   Recent Labs    12/05/19 0142 12/06/19 0520  WBC 7.1 7.9  NEUTROABS 3.7  --   HGB 13.1 12.6  HCT 38.9 37.8  MCV 87.4 88.3  PLT 359 324   No results for input(s): CKTOTAL, CKMB, TROPONINI in the last 72 hours. Invalid input(s): POCBNP No results for input(s): HGBA1C in the last 72 hours.   Weights: Filed Weights   12/05/19 0149 12/05/19 0605  Weight: 56.2 kg 56.9 kg     Radiology/Studies:  CT Angio Chest PE W and/or Wo Contrast  Result Date: 12/05/2019 CLINICAL DATA:  Left chest pain EXAM: CT ANGIOGRAPHY CHEST WITH CONTRAST TECHNIQUE: Multidetector CT imaging of the chest was performed using the standard protocol during bolus administration of intravenous contrast. Multiplanar CT image reconstructions and  MIPs were obtained to evaluate the vascular anatomy. CONTRAST:  23mL OMNIPAQUE IOHEXOL 350 MG/ML SOLN COMPARISON:  Chest radiograph dated 12/05/2019 FINDINGS: Cardiovascular: Satisfactory opacification of the bilateral pulmonary arteries to the segmental level. No evidence of pulmonary embolism. Although not tailored for evaluation of the thoracic aorta, there is no evidence of  thoracic aortic aneurysm or dissection. Atherosclerotic calcifications of the aortic arch. The heart is normal in size.  No pericardial effusion. Three vessel coronary atherosclerosis. Postsurgical changes related to prior CABG. Mediastinum/Nodes: No suspicious mediastinal lymphadenopathy. Visualized thyroid is unremarkable. Lungs/Pleura: Mild centrilobular and paraseptal emphysematous changes, upper lung predominant. Platelike scarring in the left upper lobe. Prominent epicardial fat pad along the left heart border, likely accounting for the radiographic abnormality. No focal consolidation. Evaluation of the lung parenchyma is constrained by mild respiratory motion. Within that constraint, there are no suspicious pulmonary nodules. No pleural effusion or pneumothorax. Upper Abdomen: Visualized upper abdomen is grossly unremarkable, noting vascular calcifications. Musculoskeletal: Mild degenerative changes of the visualized thoracolumbar spine. 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 Review of the MIP images confirms the above findings. IMPRESSION: No evidence of pulmonary embolism. Prominent epicardial fat pad along the left heart border, likely accounting for the radiographic abnormality. Mild scarring in the left upper lobe. No evidence of acute cardiopulmonary disease. Aortic Atherosclerosis (ICD10-I70.0) and Emphysema (ICD10-J43.9). Electronically Signed   By: Julian Hy M.D.   On: 12/05/2019 04:23   DG Chest Portable 1 View  Result Date: 12/05/2019 CLINICAL DATA:  59 year old female with chest pain. EXAM: PORTABLE CHEST 1 VIEW COMPARISON:  Chest radiograph dated 02/19/2013 FINDINGS: There is slight silhouetting of the left cardiac border. Developing infiltrate in lingula is not excluded. Clinical correlation is recommended. There is no pleural effusion or pneumothorax. The cardiac silhouette is within normal limits. Median sternotomy wires and CABG vascular  clips. Atherosclerotic calcification of the aorta. No acute osseous pathology. IMPRESSION: Slight silhouetting of the left cardiac border similar to prior radiograph which may be chronic and related to prominent pericardial fat pad. Developing infiltrate in the lingula is not excluded. Clinical correlation is recommended. Electronically Signed   By: Anner Crete M.D.   On: 12/05/2019 02:14   ECHOCARDIOGRAM COMPLETE  Result Date: 12/06/2019    ECHOCARDIOGRAM REPORT   Patient Name:   Crystal [name] Date of Exam: 12/05/2019 Medical Rec #:  [id number]      Height:       60.0 in Accession #:    [id number]     Weight:       125.4 lb Date of Birth:  16-Oct-1960      BSA:          1.531 m Patient Age:    59 years       BP:           128/75 mmHg Patient Gender: F              HR:           92 bpm. Exam Location:  [facility] Procedure: 2D Echo and Intracardiac Opacification Agent Indications:     Elevated Troponin  History:         Patient has no prior history of Echocardiogram examinations.                  Prior CABG, COPD; Risk Factors:Hypertension.  Sonographer:     LTM Referring Phys:  [id number] [place] Diagnosing Phys: Serafina Royals MD IMPRESSIONS  1. Left  ventricular ejection fraction, by estimation, is 45 to 50%. The left ventricle has mildly decreased function. The left ventricle demonstrates regional wall motion abnormalities (see scoring diagram/findings for description). Left ventricular diastolic parameters were normal.  2. Right ventricular systolic function is normal. The right ventricular size is normal. There is mildly elevated pulmonary artery systolic pressure.  3. The mitral valve is normal in structure. Mild mitral valve regurgitation.  4. The aortic valve is normal in structure. Aortic valve regurgitation is not visualized. FINDINGS  Left Ventricle: Left ventricular ejection fraction, by estimation, is 45 to 50%. The left ventricle has mildly decreased function. The left ventricle demonstrates regional  wall motion abnormalities. Definity contrast agent was given IV to delineate the left ventricular endocardial borders. The left ventricular internal cavity size was normal in size. There is no left ventricular hypertrophy. Left ventricular diastolic parameters were normal. Right Ventricle: The right ventricular size is normal. No increase in right ventricular wall thickness. Right ventricular systolic function is normal. There is mildly elevated pulmonary artery systolic pressure. The tricuspid regurgitant velocity is 2.25  m/s, and with an assumed right atrial pressure of 10 mmHg, the estimated right ventricular systolic pressure is 30.2 mmHg. Left Atrium: Left atrial size was normal in size. Right Atrium: Right atrial size was normal in size. Pericardium: There is no evidence of pericardial effusion. Mitral Valve: The mitral valve is normal in structure. Mild mitral valve regurgitation. Tricuspid Valve: The tricuspid valve is normal in structure. Tricuspid valve regurgitation is trivial. Aortic Valve: The aortic valve is normal in structure. Aortic valve regurgitation is not visualized. Aortic valve mean gradient measures 3.0 mmHg. Aortic valve peak gradient measures 5.4 mmHg. Aortic valve area, by VTI measures 1.64 cm. Pulmonic Valve: The pulmonic valve was normal in structure. Pulmonic valve regurgitation is not visualized. Aorta: The aortic root and ascending aorta are structurally normal, with no evidence of dilitation. IAS/Shunts: No atrial level shunt detected by color flow Doppler.  LEFT VENTRICLE PLAX 2D LVIDd:         4.14 cm     Diastology LVIDs:         3.22 cm     LV e' lateral:   5.77 cm/s LV PW:         0.95 cm     LV E/e' lateral: 20.3 LV IVS:        0.72 cm     LV e' medial:    6.42 cm/s LVOT diam:     1.90 cm     LV E/e' medial:  18.2 LV SV:         41 LV SV Index:   27 LVOT Area:     2.84 cm  LV Volumes (MOD) LV vol d, MOD A2C: 79.8 ml LV vol d, MOD A4C: 75.7 ml LV vol s, MOD A2C: 32.9 ml LV  vol s, MOD A4C: 42.3 ml LV SV MOD A2C:     46.9 ml LV SV MOD A4C:     75.7 ml LV SV MOD BP:      41.5 ml RIGHT VENTRICLE RV S prime:     6.64 cm/s TAPSE (M-mode): 1.7 cm LEFT ATRIUM             Index LA diam:        3.00 cm 1.96 cm/m LA Vol (A2C):   27.9 ml 18.22 ml/m LA Vol (A4C):   31.5 ml 20.58 ml/m LA Biplane Vol: 30.0 ml 19.60 ml/m  AORTIC VALVE AV  Area (Vmax):    2.00 cm AV Area (Vmean):   1.64 cm AV Area (VTI):     1.64 cm AV Vmax:           116.00 cm/s AV Vmean:          77.500 cm/s AV VTI:            0.253 m AV Peak Grad:      5.4 mmHg AV Mean Grad:      3.0 mmHg LVOT Vmax:         82.00 cm/s LVOT Vmean:        44.900 cm/s LVOT VTI:          0.146 m LVOT/AV VTI ratio: 0.58  AORTA Ao Root diam: 3.00 cm MITRAL VALVE                TRICUSPID VALVE MV Area (PHT): 4.78 cm     TR Peak grad:   20.2 mmHg MV E velocity: 117.00 cm/s  TR Vmax:        225.00 cm/s MV A velocity: 74.20 cm/s MV E/A ratio:  1.58         SHUNTS                             Systemic VTI:  0.15 m                             Systemic Diam: 1.90 cm Arnoldo Hooker MD Electronically signed by Arnoldo Hooker MD Signature Date/Time: 12/06/2019/7:26:22 AM    Final      Assessment and Recommendation  59 y.o. female 59 year old female with known coronary disease status post coronary bypass graft hypertension hyperlipidemia previous myocardial infarction with non-ST elevation myocardial infarction 1.  Continue heparin for further risk reduction myocardial infarction 2.  Isosorbide for chest pain 3.  Metoprolol lisinopril for myocardial infarction and LV systolic dysfunction 4.  Proceed to cardiac catheterization to assess coronary anatomy and further treatment thereof is necessary.  Patient understands the risk and benefits of cardiac catheterization.  This includes the possibility of death stroke heart attack infection bleeding or blood clot.  She is at low risk for conscious sedation  Signed, Arnoldo Hooker M.D. FACC

## 2019-12-06 NOTE — Progress Notes (Signed)
[facility] visited pt. per RN referral; pt. ('Crystal [name]') in bed sitting up; shared she came to [facility] via EMS w/chest pain; medical team think she suffered a small heart attack --pt. shared her symptoms were similar to three other similar incidents, each of which led to cardiac cath and stent.  Pt. will undergo cardiac cath tomorrow. Pt. lives alone but family is nearby; has two dogs (pit bull and jack russell); pt. does not attend church but watches several television preachers on a fairly regular basis.  Pt. requested prayer for procedure; no further needs expressed, but pt. is aware of [facility] availability.    12/06/19 1000  Clinical Encounter Type  Visited With Patient;Health care provider  Visit Type Initial;Social support;Spiritual support;Psychological support;Critical Care  Referral From Nurse  Spiritual Encounters  Spiritual Needs Emotional;Prayer  Stress Factors  Patient Stress Factors Health changes     12/06/19 1000  Clinical Encounter Type  Visited With Patient;Health care provider  Visit Type Initial;Social support;Spiritual support;Psychological support;Critical Care  Referral From Nurse  Spiritual Encounters  Spiritual Needs Emotional;Prayer  Stress Factors  Patient Stress Factors Health changes

## 2019-12-06 NOTE — Progress Notes (Signed)
PROGRESS NOTE    Crystal [name]  HKV:[id number] DOB: 01/16/1961 DOA: 12/05/2019  PCP: [facility]    LOS - 1   Brief Narrative:  Crystal [name]  is a 59 y.o. Caucasian female with a known history of COPD, hypertension, [name], who presented to the emergency room with acute onset of central chest pain felt as pressure and tightness and graded 9/10 in severity with associated dyspnea without nausea or vomiting or diaphoresis however with lightheadedness.   In the ED, blood pressure was 104/75 with heart rate 111 and otherwise normal vital signs.  Labs revealed borderline potassium of 3.5 and high-sensitivity troponin I was 110 and later 200 with unremarkable CBC.  She was treated with fentanyl and sublingual nitroglycerin, and IV fluid bolus.  Subsequently placed on nitro drip due to persistent chest pain.  Admitted to stepdown unit due to low blood pressures, cardiology consulted.  Subjective 3/14: Patient sleeping when seen this morning.  Awoke to voice and denied acute complaints including fevers or chills chest pain or shortness of breath.  No acute events reported overnight.  Assessment & Plan:   Active Problems:   NSTEMI (non-ST elevated myocardial infarction) (HCC)  NSTEMI Coronary artery disease status post four-vessel CABG Chronic systolic CHF -EF 45 to 50% on echo 3/13.   --Cardiology following --Continue heparin drip --Continue aspirin Plavix, Toprol-XL, lisinopril, Lipitor --Imdur for chest pain --Plan is for cardiac cath  Essential hypertension - hypotensive on admission and continues intermittent borderline blood pressures. --Continue on lisinopril and metoprolol as above, hold if MAP less than 65 --Maintain MAP greater than 65 with IV fluids or pressors as needed  COPD -not acutely exacerbated.  --Continue home albuterol nebs every 6 hours as needed  Hyperlipidemia -continue  Lipitor  Ongoing tobacco abuse - --patient counseled of need for complete smoking cessation for cardiovascular health --We will offer nicotine patches on discharge --PCP follow-up  DVT prophylaxis: Heparin drip   Code Status: Full Code  Family Communication: None at bedside during encounter  Disposition Plan: Expect discharge home pending clinical improvement and clearance by cardiology.  Patient continues require hospital care as cardiology evaluation still ongoing, as above. Coming From home Exp DC Date 3/16 Barriers as above Medically Stable for Discharge?  No  Consultants:   Cardiology  Procedures:   Echo -EF 45 to 50%  Antimicrobials:   None   Objective: Vitals:   12/06/19 0400 12/06/19 0410 12/06/19 0500 12/06/19 0600  BP:  (!) 90/59 115/70 100/75  Pulse: 80 65 85 72  Resp: 16 (!) 21 18 18   Temp:      TempSrc:      SpO2: 94% 94% 92% 93%  Weight:      Height:        Intake/Output Summary (Last 24 hours) at 12/06/2019 0812 Last data filed at 12/05/2019 2331 Gross per 24 hour  Intake 1203.8 ml  Output 2450 ml  Net -1246.2 ml   Filed Weights   12/05/19 0149 12/05/19 0605  Weight: 56.2 kg 56.9 kg    Examination:  General exam: Sleeping comfortably but arousable, no acute distress, frail Respiratory system: CTAB but diminished, no wheezes, rales or rhonchi, normal respiratory effort. Cardiovascular system: normal S1/S2, RRR, no pedal edema.   Central nervous system: no gross focal neurologic deficits, normal speech Extremities: moves all, no cyanosis, normal tone Skin: dry, intact, normal temperature, normal color Psychiatry: normal mood, congruent affect, judgement  and insight appear normal    Data Reviewed: I have personally reviewed following labs and imaging studies  CBC: Recent Labs  Lab 12/05/19 0142 12/06/19 0520  WBC 7.1 7.9  NEUTROABS 3.7  --   HGB 13.1 12.6  HCT 38.9 37.8  MCV 87.4 88.3  PLT 359 324   Basic Metabolic  Panel: Recent Labs  Lab 12/05/19 0142 12/06/19 0320  NA 135 140  K 3.5 3.8  CL 99 107  CO2 26 26  GLUCOSE 157* 95  BUN 7 6  CREATININE 0.67 0.65  CALCIUM 9.7 9.1   GFR: Estimated Creatinine Clearance: 61.4 mL/min (by C-G formula based on SCr of 0.65 mg/dL). Liver Function Tests: Recent Labs  Lab 12/05/19 0142  AST 23  ALT 24  ALKPHOS 62  BILITOT 0.3  PROT 7.7  ALBUMIN 4.4   No results for input(s): LIPASE, AMYLASE in the last 168 hours. No results for input(s): AMMONIA in the last 168 hours. Coagulation Profile: Recent Labs  Lab 12/05/19 0142  INR 0.9   Cardiac Enzymes: No results for input(s): CKTOTAL, CKMB, CKMBINDEX, TROPONINI in the last 168 hours. BNP (last 3 results) No results for input(s): PROBNP in the last 8760 hours. HbA1C: No results for input(s): HGBA1C in the last 72 hours. CBG: Recent Labs  Lab 12/05/19 0559  GLUCAP 124*   Lipid Profile: Recent Labs    12/05/19 1048  CHOL 167  HDL 43  LDLCALC 90  TRIG 171*  CHOLHDL 3.9   Thyroid Function Tests: No results for input(s): TSH, T4TOTAL, FREET4, T3FREE, THYROIDAB in the last 72 hours. Anemia Panel: No results for input(s): VITAMINB12, FOLATE, FERRITIN, TIBC, IRON, RETICCTPCT in the last 72 hours. Sepsis Labs: No results for input(s): PROCALCITON, LATICACIDVEN in the last 168 hours.  Recent Results (from the past 240 hour(s))  Respiratory Panel by RT PCR (Flu A&B, Covid) - Nasopharyngeal Swab     Status: None   Collection Time: 12/05/19  5:24 AM   Specimen: Nasopharyngeal Swab  Result Value Ref Range Status   SARS Coronavirus 2 by RT PCR NEGATIVE NEGATIVE Final    Comment: (NOTE) SARS-CoV-2 target nucleic acids are NOT DETECTED. The SARS-CoV-2 RNA is generally detectable in upper respiratoy specimens during the acute phase of infection. The lowest concentration of SARS-CoV-2 viral copies this assay can detect is 131 copies/mL. A negative result does not preclude SARS-Cov-2 infection  and should not be used as the sole basis for treatment or other patient management decisions. A negative result may occur with  improper specimen collection/handling, submission of specimen other than nasopharyngeal swab, presence of viral mutation(s) within the areas targeted by this assay, and inadequate number of viral copies (<131 copies/mL). A negative result must be combined with clinical observations, patient history, and epidemiological information. The expected result is Negative. Fact Sheet for Patients:  https://www.moore.com/ Fact Sheet for Healthcare Providers:  https://www.young.biz/ This test is not yet ap proved or cleared by the [place] FDA and  has been authorized for detection and/or diagnosis of SARS-CoV-2 by FDA under an Emergency Use Authorization (EUA). This EUA will remain  in effect (meaning this test can be used) for the duration of the COVID-19 declaration under Section 564(b)(1) of the Act, 21 U.S.C. section 360bbb-3(b)(1), unless the authorization is terminated or revoked sooner.    Influenza A by PCR NEGATIVE NEGATIVE Final   Influenza B by PCR NEGATIVE NEGATIVE Final    Comment: (NOTE) The Xpert Xpress SARS-CoV-2/FLU/RSV assay is intended as an  aid in  the diagnosis of influenza from Nasopharyngeal swab specimens and  should not be used as a sole basis for treatment. Nasal washings and  aspirates are unacceptable for Xpert Xpress SARS-CoV-2/FLU/RSV  testing. Fact Sheet for Patients: https://www.moore.com/ Fact Sheet for Healthcare Providers: https://www.young.biz/ This test is not yet approved or cleared by the [place] FDA and  has been authorized for detection and/or diagnosis of SARS-CoV-2 by  FDA under an Emergency Use Authorization (EUA). This EUA will remain  in effect (meaning this test can be used) for the duration of the  Covid-19 declaration under Section  564(b)(1) of the Act, 21  U.S.C. section 360bbb-3(b)(1), unless the authorization is  terminated or revoked. Performed at [facility], [address] [place], [place] [address]          Radiology Studies: CT Angio Chest PE W and/or Wo Contrast  Result Date: 12/05/2019 CLINICAL DATA:  Left chest pain EXAM: CT ANGIOGRAPHY CHEST WITH CONTRAST TECHNIQUE: Multidetector CT imaging of the chest was performed using the standard protocol during bolus administration of intravenous contrast. Multiplanar CT image reconstructions and MIPs were obtained to evaluate the vascular anatomy. CONTRAST:  37mL OMNIPAQUE IOHEXOL 350 MG/ML SOLN COMPARISON:  Chest radiograph dated 12/05/2019 FINDINGS: Cardiovascular: Satisfactory opacification of the bilateral pulmonary arteries to the segmental level. No evidence of pulmonary embolism. Although not tailored for evaluation of the thoracic aorta, there is no evidence of thoracic aortic aneurysm or dissection. Atherosclerotic calcifications of the aortic arch. The heart is normal in size.  No pericardial effusion. Three vessel coronary atherosclerosis. Postsurgical changes related to prior CABG. Mediastinum/Nodes: No suspicious mediastinal lymphadenopathy. Visualized thyroid is unremarkable. Lungs/Pleura: Mild centrilobular and paraseptal emphysematous changes, upper lung predominant. Platelike scarring in the left upper lobe. Prominent epicardial fat pad along the left heart border, likely accounting for the radiographic abnormality. No focal consolidation. Evaluation of the lung parenchyma is constrained by mild respiratory motion. Within that constraint, there are no suspicious pulmonary nodules. No pleural effusion or pneumothorax. Upper Abdomen: Visualized upper abdomen is grossly unremarkable, noting vascular calcifications. Musculoskeletal: Mild degenerative changes of the visualized thoracolumbar spine. Median sternotomy with chronic sternal  deformity/nonunion (sagittal image 49). Review of the MIP images confirms the above findings. IMPRESSION: No evidence of pulmonary embolism. Prominent epicardial fat pad along the left heart border, likely accounting for the radiographic abnormality. Mild scarring in the left upper lobe. No evidence of acute cardiopulmonary disease. Aortic Atherosclerosis (ICD10-I70.0) and Emphysema (ICD10-J43.9). Electronically Signed   By: Charline Bills M.D.   On: 12/05/2019 04:23   DG Chest Portable 1 View  Result Date: 12/05/2019 CLINICAL DATA:  59 year old female with chest pain. EXAM: PORTABLE CHEST 1 VIEW COMPARISON:  Chest radiograph dated 02/19/2013 FINDINGS: There is slight silhouetting of the left cardiac border. Developing infiltrate in lingula is not excluded. Clinical correlation is recommended. There is no pleural effusion or pneumothorax. The cardiac silhouette is within normal limits. Median sternotomy wires and CABG vascular clips. Atherosclerotic calcification of the aorta. No acute osseous pathology. IMPRESSION: Slight silhouetting of the left cardiac border similar to prior radiograph which may be chronic and related to prominent pericardial fat pad. Developing infiltrate in the lingula is not excluded. Clinical correlation is recommended. Electronically Signed   By: Elgie Collard M.D.   On: 12/05/2019 02:14   ECHOCARDIOGRAM COMPLETE  Result Date: 12/06/2019    ECHOCARDIOGRAM REPORT   Patient Name:   Crystal [name] Joy Date of Exam: 12/05/2019 Medical Rec #:  [id number]  Height:       60.0 in Accession #:    [id number]     Weight:       125.4 lb Date of Birth:  09-Aug-1961      BSA:          1.531 m Patient Age:    39 years       BP:           128/75 mmHg Patient Gender: F              HR:           92 bpm. Exam Location:  [facility] Procedure: 2D Echo and Intracardiac Opacification Agent Indications:     Elevated Troponin  History:         Patient has no prior history of Echocardiogram examinations.                   Prior CABG, COPD; Risk Factors:Hypertension.  Sonographer:     LTM Referring Phys:  [id number] Govan Diagnosing Phys: Serafina Royals MD IMPRESSIONS  1. Left ventricular ejection fraction, by estimation, is 45 to 50%. The left ventricle has mildly decreased function. The left ventricle demonstrates regional wall motion abnormalities (see scoring diagram/findings for description). Left ventricular diastolic parameters were normal.  2. Right ventricular systolic function is normal. The right ventricular size is normal. There is mildly elevated pulmonary artery systolic pressure.  3. The mitral valve is normal in structure. Mild mitral valve regurgitation.  4. The aortic valve is normal in structure. Aortic valve regurgitation is not visualized. FINDINGS  Left Ventricle: Left ventricular ejection fraction, by estimation, is 45 to 50%. The left ventricle has mildly decreased function. The left ventricle demonstrates regional wall motion abnormalities. Definity contrast agent was given IV to delineate the left ventricular endocardial borders. The left ventricular internal cavity size was normal in size. There is no left ventricular hypertrophy. Left ventricular diastolic parameters were normal. Right Ventricle: The right ventricular size is normal. No increase in right ventricular wall thickness. Right ventricular systolic function is normal. There is mildly elevated pulmonary artery systolic pressure. The tricuspid regurgitant velocity is 2.25  m/s, and with an assumed right atrial pressure of 10 mmHg, the estimated right ventricular systolic pressure is 60.1 mmHg. Left Atrium: Left atrial size was normal in size. Right Atrium: Right atrial size was normal in size. Pericardium: There is no evidence of pericardial effusion. Mitral Valve: The mitral valve is normal in structure. Mild mitral valve regurgitation. Tricuspid Valve: The tricuspid valve is normal in structure. Tricuspid valve regurgitation is  trivial. Aortic Valve: The aortic valve is normal in structure. Aortic valve regurgitation is not visualized. Aortic valve mean gradient measures 3.0 mmHg. Aortic valve peak gradient measures 5.4 mmHg. Aortic valve area, by VTI measures 1.64 cm. Pulmonic Valve: The pulmonic valve was normal in structure. Pulmonic valve regurgitation is not visualized. Aorta: The aortic root and ascending aorta are structurally normal, with no evidence of dilitation. IAS/Shunts: No atrial level shunt detected by color flow Doppler.  LEFT VENTRICLE PLAX 2D LVIDd:         4.14 cm     Diastology LVIDs:         3.22 cm     LV e' lateral:   5.77 cm/s LV PW:         0.95 cm     LV E/e' lateral: 20.3 LV IVS:        0.72 cm  LV e' medial:    6.42 cm/s LVOT diam:     1.90 cm     LV E/e' medial:  18.2 LV SV:         41 LV SV Index:   27 LVOT Area:     2.84 cm  LV Volumes (MOD) LV vol d, MOD A2C: 79.8 ml LV vol d, MOD A4C: 75.7 ml LV vol s, MOD A2C: 32.9 ml LV vol s, MOD A4C: 42.3 ml LV SV MOD A2C:     46.9 ml LV SV MOD A4C:     75.7 ml LV SV MOD BP:      41.5 ml RIGHT VENTRICLE RV S prime:     6.64 cm/s TAPSE (M-mode): 1.7 cm LEFT ATRIUM             Index LA diam:        3.00 cm 1.96 cm/m LA Vol (A2C):   27.9 ml 18.22 ml/m LA Vol (A4C):   31.5 ml 20.58 ml/m LA Biplane Vol: 30.0 ml 19.60 ml/m  AORTIC VALVE AV Area (Vmax):    2.00 cm AV Area (Vmean):   1.64 cm AV Area (VTI):     1.64 cm AV Vmax:           116.00 cm/s AV Vmean:          77.500 cm/s AV VTI:            0.253 m AV Peak Grad:      5.4 mmHg AV Mean Grad:      3.0 mmHg LVOT Vmax:         82.00 cm/s LVOT Vmean:        44.900 cm/s LVOT VTI:          0.146 m LVOT/AV VTI ratio: 0.58  AORTA Ao Root diam: 3.00 cm MITRAL VALVE                TRICUSPID VALVE MV Area (PHT): 4.78 cm     TR Peak grad:   20.2 mmHg MV E velocity: 117.00 cm/s  TR Vmax:        225.00 cm/s MV A velocity: 74.20 cm/s MV E/A ratio:  1.58         SHUNTS                             Systemic VTI:  0.15 m                              Systemic Diam: 1.90 cm Arnoldo HookerBruce Kowalski MD Electronically signed by Arnoldo HookerBruce Kowalski MD Signature Date/Time: 12/06/2019/7:26:22 AM    Final         Scheduled Meds: . aspirin EC  81 mg Oral Daily  . atorvastatin  40 mg Oral QHS  . Chlorhexidine Gluconate Cloth  6 each Topical Daily  . clopidogrel  75 mg Oral Daily  . isosorbide mononitrate  30 mg Oral Daily  . lisinopril  2.5 mg Oral Daily  . metoprolol succinate  25 mg Oral BID  . sodium chloride flush  3 mL Intravenous Q12H   Continuous Infusions: . sodium chloride 100 mL/hr at 12/06/19 0315  . heparin 700 Units/hr (12/05/19 1420)  . nitroGLYCERIN 60 mcg/min (12/05/19 0513)     LOS: 1 day    Time spent: 30 minutes    Pennie BanterKelly A Irmalee Riemenschneider, DO [facility]  If 7PM-7AM, please contact night-coverage [email or web address] 12/06/2019, 8:12 AM

## 2019-12-06 NOTE — Progress Notes (Signed)
CRITICAL VALUE ALERT  Critical Value: Troponin=2142  Date & Time Notied: 12/06/19 @ 0645  Provider Notified: Dr. Waldron Labs  Orders Received/Actions taken: no

## 2019-12-07 ENCOUNTER — Encounter: Payer: Self-pay | Admitting: Certified Registered Nurse Anesthetist

## 2019-12-07 ENCOUNTER — Encounter: Payer: Self-pay | Admitting: Cardiology

## 2019-12-07 ENCOUNTER — Encounter
Admission: EM | Disposition: A | Discharge status: Home / Self Care | Payer: Self-pay | Source: Home / Self Care | Attending: Internal Medicine

## 2019-12-07 HISTORY — PX: LEFT HEART CATH AND CORS/GRAFTS ANGIOGRAPHY: CATH118250

## 2019-12-07 HISTORY — PX: CORONARY STENT INTERVENTION: CATH118234

## 2019-12-07 LAB — CBC
HCT: 34.1 % — ABNORMAL LOW (ref 36.0–46.0)
Hemoglobin: 11.4 g/dL — ABNORMAL LOW (ref 12.0–15.0)
MCH: 29.5 pg (ref 26.0–34.0)
MCHC: 33.4 g/dL (ref 30.0–36.0)
MCV: 88.3 fL (ref 80.0–100.0)
Platelets: 289 10*3/uL (ref 150–400)
RBC: 3.86 MIL/uL — ABNORMAL LOW (ref 3.87–5.11)
RDW: 13.3 % (ref 11.5–15.5)
WBC: 7 10*3/uL (ref 4.0–10.5)
nRBC: 0 % (ref 0.0–0.2)

## 2019-12-07 LAB — TROPONIN I (HIGH SENSITIVITY): Troponin I (High Sensitivity): 1523 ng/L (ref <18)

## 2019-12-07 LAB — HEPARIN LEVEL (UNFRACTIONATED): Heparin Unfractionated: 0.38 IU/mL (ref 0.30–0.70)

## 2019-12-07 LAB — POCT ACTIVATED CLOTTING TIME: Activated Clotting Time: 367 seconds

## 2019-12-07 SURGERY — LEFT HEART CATH AND CORS/GRAFTS ANGIOGRAPHY
Anesthesia: Moderate Sedation

## 2019-12-07 MED ORDER — SODIUM CHLORIDE 0.9 % WEIGHT BASED INFUSION: 1.0000 mL/kg/h | INTRAVENOUS | Status: AC

## 2019-12-07 MED ORDER — TIROFIBAN (AGGRASTAT) BOLUS VIA INFUSION
INTRAVENOUS | Status: DC | PRN
  Administered 2019-12-07: 1405 ug via INTRAVENOUS

## 2019-12-07 MED ORDER — SODIUM CHLORIDE 0.9 % IV SOLN: 250.0000 mL | INTRAVENOUS | Status: DC | PRN

## 2019-12-07 MED ORDER — SODIUM CHLORIDE 0.9% FLUSH
3.0000 mL | Freq: Two times a day (BID) | INTRAVENOUS | Status: DC
  Administered 2019-12-07 – 2019-12-08 (×2): 3 mL via INTRAVENOUS

## 2019-12-07 MED ORDER — SODIUM CHLORIDE 0.9% FLUSH: 3.0000 mL | INTRAVENOUS | Status: DC | PRN

## 2019-12-07 MED ORDER — FENTANYL CITRATE (PF) 100 MCG/2ML IJ SOLN
INTRAMUSCULAR | Status: AC
  Filled 2019-12-07: qty 2

## 2019-12-07 MED ORDER — BIVALIRUDIN TRIFLUOROACETATE 250 MG IV SOLR
INTRAVENOUS | Status: AC
  Filled 2019-12-07: qty 250

## 2019-12-07 MED ORDER — ASPIRIN 81 MG PO CHEW
CHEWABLE_TABLET | ORAL | Status: DC | PRN
  Administered 2019-12-07: 243 mg via ORAL

## 2019-12-07 MED ORDER — CLOPIDOGREL BISULFATE 75 MG PO TABS
ORAL_TABLET | ORAL | Status: AC
  Filled 2019-12-07: qty 8

## 2019-12-07 MED ORDER — SODIUM CHLORIDE 0.9 % WEIGHT BASED INFUSION: 1.0000 mL/kg/h | INTRAVENOUS | Status: DC

## 2019-12-07 MED ORDER — CLOPIDOGREL BISULFATE 75 MG PO TABS
ORAL_TABLET | ORAL | Status: DC | PRN
  Administered 2019-12-07: 600 mg via ORAL

## 2019-12-07 MED ORDER — HYDRALAZINE HCL 20 MG/ML IJ SOLN: 10.0000 mg | INTRAMUSCULAR | Status: AC | PRN

## 2019-12-07 MED ORDER — FENTANYL CITRATE (PF) 100 MCG/2ML IJ SOLN
INTRAMUSCULAR | Status: DC | PRN
  Administered 2019-12-07: 25 ug via INTRAVENOUS

## 2019-12-07 MED ORDER — ONDANSETRON HCL 4 MG/2ML IJ SOLN: 4.0000 mg | Freq: Four times a day (QID) | INTRAMUSCULAR | Status: DC | PRN

## 2019-12-07 MED ORDER — HEPARIN (PORCINE) IN NACL 1000-0.9 UT/500ML-% IV SOLN
INTRAVENOUS | Status: DC | PRN
  Administered 2019-12-07: 500 mL

## 2019-12-07 MED ORDER — ASPIRIN 81 MG PO CHEW
81.0000 mg | CHEWABLE_TABLET | ORAL | Status: AC
  Administered 2019-12-07: 81 mg via ORAL

## 2019-12-07 MED ORDER — TIROFIBAN HCL IN NACL 5-0.9 MG/100ML-% IV SOLN
INTRAVENOUS | Status: AC | PRN
  Administered 2019-12-07: 0.15 ug/kg/min via INTRAVENOUS

## 2019-12-07 MED ORDER — ASPIRIN 81 MG PO CHEW
CHEWABLE_TABLET | ORAL | Status: AC
  Filled 2019-12-07: qty 1

## 2019-12-07 MED ORDER — SODIUM CHLORIDE 0.9 % WEIGHT BASED INFUSION
3.0000 mL/kg/h | INTRAVENOUS | Status: DC
  Administered 2019-12-07: 3 mL/kg/h via INTRAVENOUS

## 2019-12-07 MED ORDER — ACETAMINOPHEN 325 MG PO TABS: 650.0000 mg | ORAL_TABLET | ORAL | Status: DC | PRN

## 2019-12-07 MED ORDER — ASPIRIN 81 MG PO CHEW
CHEWABLE_TABLET | ORAL | Status: AC
  Filled 2019-12-07: qty 3

## 2019-12-07 MED ORDER — LABETALOL HCL 5 MG/ML IV SOLN: 10.0000 mg | INTRAVENOUS | Status: AC | PRN

## 2019-12-07 MED ORDER — BIVALIRUDIN BOLUS VIA INFUSION - CUPID
INTRAVENOUS | Status: DC | PRN
  Administered 2019-12-07: 09:00:00 42.15 mg via INTRAVENOUS

## 2019-12-07 MED ORDER — MIDAZOLAM HCL 2 MG/2ML IJ SOLN
INTRAMUSCULAR | Status: DC | PRN
  Administered 2019-12-07: 0.5 mg via INTRAVENOUS
  Administered 2019-12-07: 1 mg via INTRAVENOUS

## 2019-12-07 MED ORDER — IOHEXOL 300 MG/ML  SOLN
INTRAMUSCULAR | Status: DC | PRN
  Administered 2019-12-07: 09:00:00 155 mL

## 2019-12-07 MED ORDER — MIDAZOLAM HCL 2 MG/2ML IJ SOLN
INTRAMUSCULAR | Status: AC
  Filled 2019-12-07: qty 2

## 2019-12-07 MED ORDER — MIDAZOLAM HCL 2 MG/2ML IJ SOLN
INTRAMUSCULAR | Status: DC | PRN
  Administered 2019-12-07: 0.5 mg via INTRAVENOUS

## 2019-12-07 MED ORDER — NITROGLYCERIN 1 MG/10 ML FOR IR/CATH LAB
INTRA_ARTERIAL | Status: AC
  Filled 2019-12-07: qty 10

## 2019-12-07 MED ORDER — CLOPIDOGREL BISULFATE 75 MG PO TABS
75.0000 mg | ORAL_TABLET | Freq: Every day | ORAL | Status: DC
  Administered 2019-12-08: 75 mg via ORAL
  Filled 2019-12-07: qty 1

## 2019-12-07 MED ORDER — ASPIRIN 81 MG PO CHEW
81.0000 mg | CHEWABLE_TABLET | Freq: Every day | ORAL | Status: DC
  Filled 2019-12-07: qty 1

## 2019-12-07 MED ORDER — SODIUM CHLORIDE 0.9 % IV SOLN
INTRAVENOUS | Status: AC | PRN
  Administered 2019-12-07: 1.75 mg/kg/h via INTRAVENOUS

## 2019-12-07 MED ORDER — HEPARIN (PORCINE) IN NACL 1000-0.9 UT/500ML-% IV SOLN
INTRAVENOUS | Status: AC
  Filled 2019-12-07: qty 1000

## 2019-12-07 MED ORDER — IOHEXOL 300 MG/ML  SOLN
INTRAMUSCULAR | Status: DC | PRN
  Administered 2019-12-07: 110 mL via INTRA_ARTERIAL

## 2019-12-07 SURGICAL SUPPLY — 19 items
BALLN MINITREK RX 2.0X15 (BALLOONS) ×3
BALLOON MINITREK RX 2.0X15 (BALLOONS) ×1 IMPLANT
CATH INFINITI 5 FR LCB (CATHETERS) ×3 IMPLANT
CATH INFINITI 5FR ANG PIGTAIL (CATHETERS) ×3 IMPLANT
CATH INFINITI 5FR JL4 (CATHETERS) ×3 IMPLANT
CATH INFINITI JR4 5F (CATHETERS) ×3 IMPLANT
CATH VISTA GUIDE 6FR JR4 (CATHETERS) ×3 IMPLANT
CATH VISTA GUIDE 6FR LCB (CATHETERS) ×3 IMPLANT
DEVICE CLOSURE MYNXGRIP 6/7F (Vascular Products) ×3 IMPLANT
DEVICE INFLAT 30 PLUS (MISCELLANEOUS) ×3 IMPLANT
DEVICE SPIDERFX EMB PROT 4MM (WIRE) ×3 IMPLANT
KIT MANI 3VAL PERCEP (MISCELLANEOUS) ×3 IMPLANT
NEEDLE PERC 18GX7CM (NEEDLE) ×3 IMPLANT
PACK CARDIAC CATH (CUSTOM PROCEDURE TRAY) ×3 IMPLANT
SHEATH AVANTI 5FR X 11CM (SHEATH) ×3 IMPLANT
SHEATH AVANTI 6FR X 11CM (SHEATH) ×3 IMPLANT
STENT RESOLUTE ONYX 3.5X18 (Permanent Stent) ×3 IMPLANT
WIRE ASAHI PROWATER 180CM (WIRE) ×6 IMPLANT
WIRE GUIDERIGHT .035X150 (WIRE) ×3 IMPLANT

## 2019-12-07 NOTE — Progress Notes (Signed)
Patient just returned from specials 

## 2019-12-07 NOTE — Progress Notes (Signed)
ANTICOAGULATION CONSULT NOTE - Initial Consult  Pharmacy Consult for heparin Indication: chest pain/ACS  Allergies  Allergen Reactions  . Trazodone And Nefazodone Palpitations    Patient Measurements: Height: 5' (152.4 cm) Weight: 125 lb 7.1 oz (56.9 kg) IBW/kg (Calculated) : 45.5 Heparin Dosing Weight: 56.2 kg  Vital Signs: Temp: 98.2 F (36.8 C) (03/15 0300) Temp Source: Oral (03/15 0300) BP: 84/55 (03/15 0400) Pulse Rate: 72 (03/15 0400)  Labs: Recent Labs    12/05/19 0142 12/05/19 0142 12/05/19 0329 12/05/19 1950 12/06/19 0320 12/06/19 0520 12/06/19 0602 12/07/19 0501  HGB 13.1   < >  --   --   --  12.6  --  11.4*  HCT 38.9  --   --   --   --  37.8  --  34.1*  PLT 359  --   --   --   --  324  --  289  APTT 26  --   --   --   --   --   --   --   LABPROT 11.5  --   --   --   --   --   --   --   INR 0.9  --   --   --   --   --   --   --   HEPARINUNFRC  --   --    < > 0.51 0.60  --   --  0.38  CREATININE 0.67  --   --   --  0.65  --   --   --   TROPONINIHS 110*  --    < > 3,557*  --   --  2,142* 1,523*   < > = values in this interval not displayed.    Estimated Creatinine Clearance: 61.4 mL/min (by C-G formula based on SCr of 0.65 mg/dL).   Medical History: Past Medical History:  Diagnosis Date  . COPD (chronic obstructive pulmonary disease) ([place])   . Headache   . Hypertension   . Myocardial infarction [facility]) 2005   Bypass 4X stint 2014/2015    Medications:  Scheduled:  . aspirin EC  81 mg Oral Daily  . atorvastatin  40 mg Oral QHS  . Chlorhexidine Gluconate Cloth  6 each Topical Daily  . clopidogrel  75 mg Oral Daily  . isosorbide mononitrate  30 mg Oral Daily  . lisinopril  2.5 mg Oral Daily  . metoprolol succinate  25 mg Oral BID  . sodium chloride flush  3 mL Intravenous Q12H    Assessment: Patient admitted x CP w/ h/o COPD, CAD s/p MI w/ x 4 stents, HTN, patient w/ initial trop of 110 >> 200, EKG showing slight ST elevation in leads V1, V3,  aVR and depression in lead V5. Patient baseline CBC WNL, INR WNL, baseline aPTT pending. Patient does not appear to be on anticoagulation PTA. Patient is being started on heparin drip for management of NSTEMI.  Will bolus heparin 3400 units IV x 1 Will start heparin rate at 800 units/hr. Will check anti-Xa at 1100.  3/13 @ 1048 HL= 1.15 supratherapeutic. Will hold heparin drip x 1 hour and decrease to 700 units/hr.  Goal of Therapy:  Heparin level 0.3-0.7 units/ml Monitor platelets by anticoagulation protocol: Yes   Plan:  03/15 @ 0500 HL 0.38 therapeutic. Will continue current rate and will recheck HL w/ am labs, CBC continue to trend down will continue to monitor.  Tobie Lords, PharmD Clinical Pharmacist  12/07/2019 5:55 AM

## 2019-12-07 NOTE — Progress Notes (Signed)
Brief Pharmacy Note  Pharmacy consulted for Aggrastat post-cath to run for approximately 7 hours. Called Specials and spoke with nurse. Heparin has been discontinued, patient currently has Angiomax and Aggrastat running. Angiomax to stop at 1330 and Aggrastat to run until the bottle is empty. Will check CBC with morning labs.  Laureen Ochs, PharmD

## 2019-12-07 NOTE — Progress Notes (Signed)
PROGRESS NOTE    Crystal [name]  WER:[id number] DOB: December 19, 1960 DOA: 12/05/2019  PCP: [facility]    LOS - 2   Brief Narrative:  [name], hypertension, [name], who presented to the emergency roomwith acute onset of central chest pain felt as pressure and tightness and graded 9/10 in severity with associated dyspnea without nausea or vomiting or diaphoresis however with lightheadedness.   In the ED, blood pressure was 104/75 with heart rate 111 and otherwise normal vital signs. Labs revealed borderline potassium of 3.5 and high-sensitivity troponin I was 110 and later 200 with unremarkable CBC.  She was treated with fentanyl and sublingual nitroglycerin, and IV fluid bolus.  Subsequently placed on nitro drip due to persistent chest pain.  Admitted to stepdown unit due to low blood pressures, cardiology consulted.  Underwent cardiac cath on 3/15 which showed significant stenosis of graft to obtuse marginal 2, stent placed.  EF on cath 40% with inferior apical hypokinesis.  Subjective 3/15: Patient seen this morning at bedside.  No acute events reported overnight.  She denies chest pain at this time.  Also denies other acute complaints including fevers or chills, nausea vomiting diarrhea, shortness of breath.  Assessment & Plan:   Active Problems:   NSTEMI (non-ST elevated myocardial infarction) (HCC)   NSTEMI Coronary artery disease status post four-vessel CABG Chronic systolic CHF -EF 45 to 50% on echo 3/13.    Left heart cath on 3/15 showed significant stenosis of graft and obtuse marginal 2, stent placed. --Cardiology following --Continue heparin drip --Continue aspirin Plavix, Toprol-XL, lisinopril, Lipitor --Imdur for chest pain --Start cardiac rehab  Essential hypertension - hypotensive on admission and continues intermittent borderline  blood pressures. --Continue on lisinopril and metoprolol as above, hold if MAP less than 65 --Maintain MAP greater than 65 with IV fluids or pressors as needed  COPD -not acutely exacerbated.  --Continue home albuterol nebs every 6 hours as needed  Hyperlipidemia -continue Lipitor  Ongoing tobacco abuse - --patient counseled of need for complete smoking cessation for cardiovascular health --We will offer nicotine patches on discharge --PCP follow-up  DVT prophylaxis: Heparin drip   Code Status: Full Code  Family Communication: None at bedside during encounter  Disposition Plan: Expect discharge home pending clinical improvement and clearance by cardiology.  Patient continues require hospital care as cardiology evaluation still ongoing, as above. Coming From home Exp DC Date 3/16 Barriers as above Medically Stable for Discharge?  No  Consultants:   Cardiology  Procedures:   Echo -EF 45 to 50%  Antimicrobials:   None  Objective: Vitals:   12/07/19 1015 12/07/19 1030 12/07/19 1100 12/07/19 1200  BP: 131/74 103/82 132/80 113/78  Pulse: 74 72 77 86  Resp: (!) 24 18 (!) 22 (!) 22  Temp:   (!) 97.5 F (36.4 C)   TempSrc:   Oral   SpO2: 97% 98% 97% 100%  Weight:      Height:        Intake/Output Summary (Last 24 hours) at 12/07/2019 1349 Last data filed at 12/07/2019 0000 Gross per 24 hour  Intake 3402.46 ml  Output 850 ml  Net 2552.46 ml   Filed Weights   12/05/19 0149 12/05/19 0605 12/07/19 0714  Weight: 56.2 kg 56.9 kg 56.2 kg    Examination:  General exam: awake, alert, no acute distress, frail Respiratory system: Decreased breath sounds but clear,  no wheezes, rales or rhonchi, normal respiratory effort. Cardiovascular system: normal S1/S2, RRR, no pedal edema.   Central nervous system: A&O x3. no gross focal neurologic deficits, normal speech Extremities: moves all, no cyanosis, normal tone Skin: dry, intact, normal temperature Psychiatry:  normal mood, congruent affect, judgement and insight appear normal    Data Reviewed: I have personally reviewed following labs and imaging studies  CBC: Recent Labs  Lab 12/05/19 0142 12/06/19 0520 12/07/19 0501  WBC 7.1 7.9 7.0  NEUTROABS 3.7  --   --   HGB 13.1 12.6 11.4*  HCT 38.9 37.8 34.1*  MCV 87.4 88.3 88.3  PLT 359 324 361   Basic Metabolic Panel: Recent Labs  Lab 12/05/19 0142 12/06/19 0320  NA 135 140  K 3.5 3.8  CL 99 107  CO2 26 26  GLUCOSE 157* 95  BUN 7 6  CREATININE 0.67 0.65  CALCIUM 9.7 9.1   GFR: Estimated Creatinine Clearance: 61 mL/min (by C-G formula based on SCr of 0.65 mg/dL). Liver Function Tests: Recent Labs  Lab 12/05/19 0142  AST 23  ALT 24  ALKPHOS 62  BILITOT 0.3  PROT 7.7  ALBUMIN 4.4   No results for input(s): LIPASE, AMYLASE in the last 168 hours. No results for input(s): AMMONIA in the last 168 hours. Coagulation Profile: Recent Labs  Lab 12/05/19 0142  INR 0.9   Cardiac Enzymes: No results for input(s): CKTOTAL, CKMB, CKMBINDEX, TROPONINI in the last 168 hours. BNP (last 3 results) No results for input(s): PROBNP in the last 8760 hours. HbA1C: No results for input(s): HGBA1C in the last 72 hours. CBG: Recent Labs  Lab 12/05/19 0559  GLUCAP 124*   Lipid Profile: Recent Labs    12/05/19 1048  CHOL 167  HDL 43  LDLCALC 90  TRIG 171*  CHOLHDL 3.9   Thyroid Function Tests: No results for input(s): TSH, T4TOTAL, FREET4, T3FREE, THYROIDAB in the last 72 hours. Anemia Panel: No results for input(s): VITAMINB12, FOLATE, FERRITIN, TIBC, IRON, RETICCTPCT in the last 72 hours. Sepsis Labs: No results for input(s): PROCALCITON, LATICACIDVEN in the last 168 hours.  Recent Results (from the past 240 hour(s))  Respiratory Panel by RT PCR (Flu A&B, Covid) - Nasopharyngeal Swab     Status: None   Collection Time: 12/05/19  5:24 AM   Specimen: Nasopharyngeal Swab  Result Value Ref Range Status   SARS Coronavirus 2  by RT PCR NEGATIVE NEGATIVE Final    Comment: (NOTE) SARS-CoV-2 target nucleic acids are NOT DETECTED. The SARS-CoV-2 RNA is generally detectable in upper respiratoy specimens during the acute phase of infection. The lowest concentration of SARS-CoV-2 viral copies this assay can detect is 131 copies/mL. A negative result does not preclude SARS-Cov-2 infection and should not be used as the sole basis for treatment or other patient management decisions. A negative result may occur with  improper specimen collection/handling, submission of specimen other than nasopharyngeal swab, presence of viral mutation(s) within the areas targeted by this assay, and inadequate number of viral copies (<131 copies/mL). A negative result must be combined with clinical observations, patient history, and epidemiological information. The expected result is Negative. Fact Sheet for Patients:  PinkCheek.be Fact Sheet for Healthcare Providers:  GravelBags.it This test is not yet ap proved or cleared by the [place] FDA and  has been authorized for detection and/or diagnosis of SARS-CoV-2 by FDA under an Emergency Use Authorization (EUA). This EUA will remain  in effect (meaning this test can be used)  for the duration of the COVID-19 declaration under Section 564(b)(1) of the Act, 21 U.S.C. section 360bbb-3(b)(1), unless the authorization is terminated or revoked sooner.    Influenza A by PCR NEGATIVE NEGATIVE Final   Influenza B by PCR NEGATIVE NEGATIVE Final    Comment: (NOTE) The Xpert Xpress SARS-CoV-2/FLU/RSV assay is intended as an aid in  the diagnosis of influenza from Nasopharyngeal swab specimens and  should not be used as a sole basis for treatment. Nasal washings and  aspirates are unacceptable for Xpert Xpress SARS-CoV-2/FLU/RSV  testing. Fact Sheet for Patients: https://www.moore.com/ Fact Sheet for Healthcare  Providers: https://www.young.biz/ This test is not yet approved or cleared by the [place] FDA and  has been authorized for detection and/or diagnosis of SARS-CoV-2 by  FDA under an Emergency Use Authorization (EUA). This EUA will remain  in effect (meaning this test can be used) for the duration of the  Covid-19 declaration under Section 564(b)(1) of the Act, 21  U.S.C. section 360bbb-3(b)(1), unless the authorization is  terminated or revoked. Performed at [facility], [address], [place], [place] [address]          Radiology Studies: CARDIAC CATHETERIZATION  Result Date: 12/07/2019  Prox Graft lesion is 95% stenosed.  A drug-eluting stent was successfully placed using a STENT RESOLUTE ONYX 3.5X18.  Post intervention, there is a 0% residual stenosis.  1.  Successful PCI with DES SVG to OM 2   CARDIAC CATHETERIZATION  Result Date: 12/07/2019  Ost RCA lesion is 100% stenosed.  Prox Cx lesion is 100% stenosed.  Ost LAD to Prox LAD lesion is 100% stenosed.  Prox Graft to Mid Graft lesion is 20% stenosed.  Prox Graft to Mid Graft lesion is 20% stenosed.  Dist Graft lesion is 45% stenosed.  Mid Graft lesion is 99% stenosed.  Dist RCA lesion is 100% stenosed.  Mid Cx lesion is 100% stenosed.  59 year old female with hypertension hyperlipidemia previous coronary bypass graft with previous stenting of grafts having a non-ST elevation myocardial infarction Left ventricle showing mild to moderate LV systolic dysfunction with ejection fraction of 35 to 40% with inferior hypokinesis Right coronary artery circumflex artery left anterior descending artery occluded LIMA graft to LAD patent SVG graft to D1 patent SVG graft to right coronary artery with stent patent with minimal atherosclerosis SVG graft to obtuse marginal 2 with patent stent but 99% distal stenosis Plan PCI and stent placement of graft to obtuse marginal 2 Dual antiplatelet therapy High  intensity cholesterol therapy Beta-blocker ACE inhibitor for myocardial infarction LV systolic dysfunction Begin cardiac rehabilitation        Scheduled Meds: . aspirin      . aspirin  81 mg Oral Daily  . aspirin EC  81 mg Oral Daily  . atorvastatin  40 mg Oral QHS  . Chlorhexidine Gluconate Cloth  6 each Topical Daily  . clopidogrel  75 mg Oral Daily  . [START ON 12/08/2019] clopidogrel  75 mg Oral Q breakfast  . isosorbide mononitrate  30 mg Oral Daily  . lisinopril  2.5 mg Oral Daily  . metoprolol succinate  25 mg Oral BID  . sodium chloride flush  3 mL Intravenous Q12H  . sodium chloride flush  3 mL Intravenous Q12H   Continuous Infusions: . sodium chloride 100 mL/hr at 12/07/19 0000  . sodium chloride    . sodium chloride    . nitroGLYCERIN Stopped (12/05/19 1500)     LOS: 2 days  Time spent: 30 minutes    Pennie Banter, DO [facility]   If 7PM-7AM, please contact night-coverage [email or web address] 12/07/2019, 1:49 PM

## 2019-12-07 NOTE — Progress Notes (Signed)
[facility] Cardiology [facility] Encounter Note  Patient: Crystal [name] / Admit Date: 12/05/2019 / Date of Encounter: 12/07/2019, 8:33 AM   Subjective: Patient feeling much better today.  No evidence of significant chest discomfort or congestive heart failure.  Patient's telemetry is stable.  Elevation of troponin at 3557 consistent with non-ST elevation myocardial infarction. Echocardiogram showing mild apical akinesis consistent with myocardial infarction with ejection fraction of 50% Cardiac catheterization showing inferior apical hypokinesis with ejection fraction of 40% Occluded native coronary arteries Patent graft to LAD, right coronary artery, diagonal, Significant stenosis of graft to obtuse marginal 2 as culprit artery stenosis causing non-ST elevation myocardial infarction Review of Systems: Positive for: None Negative for: Vision change, hearing change, syncope, dizziness, nausea, vomiting,diarrhea, bloody stool, stomach pain, cough, congestion, diaphoresis, urinary frequency, urinary pain,skin lesions, skin rashes Others previously listed  Objective: Telemetry: Normal sinus rhythm Physical Exam: Blood pressure 111/83, pulse 79, temperature 98 F (36.7 C), temperature source Oral, resp. rate (!) 21, height 5' (1.524 m), weight 56.2 kg, SpO2 100 %. Body mass index is 24.22 kg/m. General: Well developed, well nourished, in no acute distress. Head: Normocephalic, atraumatic, sclera non-icteric, no xanthomas, nares are without discharge. Neck: No apparent masses Lungs: Normal respirations with no wheezes, no rhonchi, no rales , no crackles   Heart: Regular rate and rhythm, normal S1 S2, no murmur, no rub, no gallop, PMI is normal size and placement, carotid upstroke normal without bruit, jugular venous pressure normal Abdomen: Soft, non-tender, non-distended with normoactive bowel sounds. No hepatosplenomegaly. Abdominal aorta is normal size without bruit Extremities: No edema,  no clubbing, no cyanosis, no ulcers,  Peripheral: 2+ radial, 2+ femoral, 2+ dorsal pedal pulses Neuro: Alert and oriented. Moves all extremities spontaneously. Psych:  Responds to questions appropriately with a normal affect.   Intake/Output Summary (Last 24 hours) at 12/07/2019 0833 Last data filed at 12/07/2019 0000 Gross per 24 hour  Intake 3402.46 ml  Output 1250 ml  Net 2152.46 ml    Inpatient Medications:  . aspirin      . [MAR Hold] aspirin EC  81 mg Oral Daily  . [MAR Hold] atorvastatin  40 mg Oral QHS  . [MAR Hold] Chlorhexidine Gluconate Cloth  6 each Topical Daily  . [MAR Hold] clopidogrel  75 mg Oral Daily  . [MAR Hold] isosorbide mononitrate  30 mg Oral Daily  . [MAR Hold] lisinopril  2.5 mg Oral Daily  . [MAR Hold] metoprolol succinate  25 mg Oral BID  . [MAR Hold] sodium chloride flush  3 mL Intravenous Q12H   Infusions:  . sodium chloride 100 mL/hr at 12/07/19 0000  . sodium chloride    . [START ON 12/08/2019] sodium chloride 3 mL/kg/hr (12/07/19 0720)   Followed by  . [START ON 12/08/2019] sodium chloride    . heparin 700 Units/hr (12/06/19 1245)  . [MAR Hold] nitroGLYCERIN Stopped (12/05/19 1500)    Labs: Recent Labs    12/05/19 0142 12/06/19 0320  NA 135 140  K 3.5 3.8  CL 99 107  CO2 26 26  GLUCOSE 157* 95  BUN 7 6  CREATININE 0.67 0.65  CALCIUM 9.7 9.1   Recent Labs    12/05/19 0142  AST 23  ALT 24  ALKPHOS 62  BILITOT 0.3  PROT 7.7  ALBUMIN 4.4   Recent Labs    12/05/19 0142 12/05/19 0142 12/06/19 0520 12/07/19 0501  WBC 7.1   < > 7.9 7.0  NEUTROABS 3.7  --   --   --  HGB 13.1   < > 12.6 11.4*  HCT 38.9   < > 37.8 34.1*  MCV 87.4   < > 88.3 88.3  PLT 359   < > 324 289   < > = values in this interval not displayed.   No results for input(s): CKTOTAL, CKMB, TROPONINI in the last 72 hours. Invalid input(s): POCBNP No results for input(s): HGBA1C in the last 72 hours.   Weights: Filed Weights   12/05/19 0149 12/05/19 0605  12/07/19 0714  Weight: 56.2 kg 56.9 kg 56.2 kg     Radiology/Studies:  CT Angio Chest PE W and/or Wo Contrast  Result Date: 12/05/2019 CLINICAL DATA:  Left chest pain EXAM: CT ANGIOGRAPHY CHEST WITH CONTRAST TECHNIQUE: Multidetector CT imaging of the chest was performed using the standard protocol during bolus administration of intravenous contrast. Multiplanar CT image reconstructions and MIPs were obtained to evaluate the vascular anatomy. CONTRAST:  43mL OMNIPAQUE IOHEXOL 350 MG/ML SOLN COMPARISON:  Chest radiograph dated 12/05/2019 FINDINGS: Cardiovascular: Satisfactory opacification of the bilateral pulmonary arteries to the segmental level. No evidence of pulmonary embolism. Although not tailored for evaluation of the thoracic aorta, there is no evidence of thoracic aortic aneurysm or dissection. Atherosclerotic calcifications of the aortic arch. The heart is normal in size.  No pericardial effusion. Three vessel coronary atherosclerosis. Postsurgical changes related to prior CABG. Mediastinum/Nodes: No suspicious mediastinal lymphadenopathy. Visualized thyroid is unremarkable. Lungs/Pleura: Mild centrilobular and paraseptal emphysematous changes, upper lung predominant. Platelike scarring in the left upper lobe. Prominent epicardial fat pad along the left heart border, likely accounting for the radiographic abnormality. No focal consolidation. Evaluation of the lung parenchyma is constrained by mild respiratory motion. Within that constraint, there are no suspicious pulmonary nodules. No pleural effusion or pneumothorax. Upper Abdomen: Visualized upper abdomen is grossly unremarkable, noting vascular calcifications. Musculoskeletal: Mild degenerative changes of the visualized thoracolumbar spine. 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 Review of the MIP images confirms the above findings. IMPRESSION: No evidence of pulmonary embolism. Prominent epicardial fat pad  along the left heart border, likely accounting for the radiographic abnormality. Mild scarring in the left upper lobe. No evidence of acute cardiopulmonary disease. Aortic Atherosclerosis (ICD10-I70.0) and Emphysema (ICD10-J43.9). Electronically Signed   By: Charline Bills M.D.   On: 12/05/2019 04:23   DG Chest Portable 1 View  Result Date: 12/05/2019 CLINICAL DATA:  59 year old female with chest pain. EXAM: PORTABLE CHEST 1 VIEW COMPARISON:  Chest radiograph dated 02/19/2013 FINDINGS: There is slight silhouetting of the left cardiac border. Developing infiltrate in lingula is not excluded. Clinical correlation is recommended. There is no pleural effusion or pneumothorax. The cardiac silhouette is within normal limits. Median sternotomy wires and CABG vascular clips. Atherosclerotic calcification of the aorta. No acute osseous pathology. IMPRESSION: Slight silhouetting of the left cardiac border similar to prior radiograph which may be chronic and related to prominent pericardial fat pad. Developing infiltrate in the lingula is not excluded. Clinical correlation is recommended. Electronically Signed   By: Elgie Collard M.D.   On: 12/05/2019 02:14   ECHOCARDIOGRAM COMPLETE  Result Date: 12/06/2019    ECHOCARDIOGRAM REPORT   Patient Name:   Crystal [name] Date of Exam: 12/05/2019 Medical Rec #:  [id number]      Height:       60.0 in Accession #:    [id number]     Weight:       125.4 lb Date of Birth:  22-Feb-1961  BSA:          1.531 m Patient Age:    58 years       BP:           128/75 mmHg Patient Gender: F              HR:           92 bpm. Exam Location:  [facility] Procedure: 2D Crystal and Intracardiac Opacification Agent Indications:     Elevated Troponin  History:         Patient has no prior history of Echocardiogram examinations.                  Prior CABG, COPD; Risk Factors:Hypertension.  Sonographer:     LTM Referring Phys:  [id number] Vernetta Honey MANSY Diagnosing Phys: Arnoldo Hooker MD IMPRESSIONS  1.  Left ventricular ejection fraction, by estimation, is 45 to 50%. The left ventricle has mildly decreased function. The left ventricle demonstrates regional wall motion abnormalities (see scoring diagram/findings for description). Left ventricular diastolic parameters were normal.  2. Right ventricular systolic function is normal. The right ventricular size is normal. There is mildly elevated pulmonary artery systolic pressure.  3. The mitral valve is normal in structure. Mild mitral valve regurgitation.  4. The aortic valve is normal in structure. Aortic valve regurgitation is not visualized. FINDINGS  Left Ventricle: Left ventricular ejection fraction, by estimation, is 45 to 50%. The left ventricle has mildly decreased function. The left ventricle demonstrates regional wall motion abnormalities. Definity contrast agent was given IV to delineate the left ventricular endocardial borders. The left ventricular internal cavity size was normal in size. There is no left ventricular hypertrophy. Left ventricular diastolic parameters were normal. Right Ventricle: The right ventricular size is normal. No increase in right ventricular wall thickness. Right ventricular systolic function is normal. There is mildly elevated pulmonary artery systolic pressure. The tricuspid regurgitant velocity is 2.25  m/s, and with an assumed right atrial pressure of 10 mmHg, the estimated right ventricular systolic pressure is 30.2 mmHg. Left Atrium: Left atrial size was normal in size. Right Atrium: Right atrial size was normal in size. Pericardium: There is no evidence of pericardial effusion. Mitral Valve: The mitral valve is normal in structure. Mild mitral valve regurgitation. Tricuspid Valve: The tricuspid valve is normal in structure. Tricuspid valve regurgitation is trivial. Aortic Valve: The aortic valve is normal in structure. Aortic valve regurgitation is not visualized. Aortic valve mean gradient measures 3.0 mmHg. Aortic valve  peak gradient measures 5.4 mmHg. Aortic valve area, by VTI measures 1.64 cm. Pulmonic Valve: The pulmonic valve was normal in structure. Pulmonic valve regurgitation is not visualized. Aorta: The aortic root and ascending aorta are structurally normal, with no evidence of dilitation. IAS/Shunts: No atrial level shunt detected by color flow Doppler.  LEFT VENTRICLE PLAX 2D LVIDd:         4.14 cm     Diastology LVIDs:         3.22 cm     LV e' lateral:   5.77 cm/s LV PW:         0.95 cm     LV E/e' lateral: 20.3 LV IVS:        0.72 cm     LV e' medial:    6.42 cm/s LVOT diam:     1.90 cm     LV E/e' medial:  18.2 LV SV:         41 LV  SV Index:   27 LVOT Area:     2.84 cm  LV Volumes (MOD) LV vol d, MOD A2C: 79.8 ml LV vol d, MOD A4C: 75.7 ml LV vol s, MOD A2C: 32.9 ml LV vol s, MOD A4C: 42.3 ml LV SV MOD A2C:     46.9 ml LV SV MOD A4C:     75.7 ml LV SV MOD BP:      41.5 ml RIGHT VENTRICLE RV S prime:     6.64 cm/s TAPSE (M-mode): 1.7 cm LEFT ATRIUM             Index LA diam:        3.00 cm 1.96 cm/m LA Vol (A2C):   27.9 ml 18.22 ml/m LA Vol (A4C):   31.5 ml 20.58 ml/m LA Biplane Vol: 30.0 ml 19.60 ml/m  AORTIC VALVE AV Area (Vmax):    2.00 cm AV Area (Vmean):   1.64 cm AV Area (VTI):     1.64 cm AV Vmax:           116.00 cm/s AV Vmean:          77.500 cm/s AV VTI:            0.253 m AV Peak Grad:      5.4 mmHg AV Mean Grad:      3.0 mmHg LVOT Vmax:         82.00 cm/s LVOT Vmean:        44.900 cm/s LVOT VTI:          0.146 m LVOT/AV VTI ratio: 0.58  AORTA Ao Root diam: 3.00 cm MITRAL VALVE                TRICUSPID VALVE MV Area (PHT): 4.78 cm     TR Peak grad:   20.2 mmHg MV E velocity: 117.00 cm/s  TR Vmax:        225.00 cm/s MV A velocity: 74.20 cm/s MV E/A ratio:  1.58         SHUNTS                             Systemic VTI:  0.15 m                             Systemic Diam: 1.90 cm Arnoldo Hooker MD Electronically signed by Arnoldo Hooker MD Signature Date/Time: 12/06/2019/7:26:22 AM    Final       Assessment and Recommendation  59 y.o. female 59 year old female with known coronary disease status post coronary bypass graft hypertension hyperlipidemia previous myocardial infarction with non-ST elevation myocardial infarction With new significant stenosis of 99% of graft to obtuse marginal 2 causing above 1.  Continue heparin for further risk reduction myocardial infarction 2.  Isosorbide for chest pain 3.  Metoprolol lisinopril for myocardial infarction and LV systolic dysfunction 4.  Proceed to PCI and stent placement of graft to obtuse marginal 2  5.  Dual antiplatelet. 6.  High intensity cholesterol therapy 7.  Begin cardiac rehabilitation signed, Arnoldo Hooker M.D. FACC

## 2019-12-08 LAB — BASIC METABOLIC PANEL
Anion gap: 10 (ref 5–15)
BUN: 7 mg/dL (ref 6–20)
CO2: 25 mmol/L (ref 22–32)
Calcium: 9.1 mg/dL (ref 8.9–10.3)
Chloride: 104 mmol/L (ref 98–111)
Creatinine, Ser: 0.66 mg/dL (ref 0.44–1.00)
GFR calc Af Amer: >60 mL/min (ref >60)
GFR calc non Af Amer: >60 mL/min (ref >60)
Glucose, Bld: 105 mg/dL — ABNORMAL HIGH (ref 70–99)
Potassium: 3.5 mmol/L (ref 3.5–5.1)
Sodium: 139 mmol/L (ref 135–145)

## 2019-12-08 LAB — CBC
HCT: 34.5 % — ABNORMAL LOW (ref 36.0–46.0)
Hemoglobin: 11.5 g/dL — ABNORMAL LOW (ref 12.0–15.0)
MCH: 29.3 pg (ref 26.0–34.0)
MCHC: 33.3 g/dL (ref 30.0–36.0)
MCV: 88 fL (ref 80.0–100.0)
Platelets: 299 10*3/uL (ref 150–400)
RBC: 3.92 MIL/uL (ref 3.87–5.11)
RDW: 13.2 % (ref 11.5–15.5)
WBC: 8.4 10*3/uL (ref 4.0–10.5)
nRBC: 0 % (ref 0.0–0.2)

## 2019-12-08 NOTE — Discharge Summary (Signed)
Physician Discharge Summary  Crystal [name] YDX:[id number] DOB: Jan 19, 1961 DOA: 12/05/2019  PCP: [facility]  Admit date: 12/05/2019 Discharge date: 12/08/2019  Admitted From: home Disposition:  home  Recommendations for Outpatient Follow-up:  1. Follow up with PCP in 1-2 weeks 2. Please obtain BMP/CBC in one week 3. Please follow up with cardiology in 2 weeks  Home Health: no  Equipment/Devices: none   Discharge Condition:stable  CODE STATUS: full  Diet recommendation: heart healthy  Brief/Interim Summary:  [name], hypertension, [name], who presented to the emergency roomwith acute onset of central chest pain felt as pressure and tightness and graded 9/10 in severity with associated dyspnea without nausea or vomiting or diaphoresis however with lightheadedness.In the ED,blood pressure was 104/75 with heart rate 111 and otherwise normal vital signs. Labs revealed borderline potassium of 3.5 and high-sensitivity troponin I was 110 and later 200 with unremarkable CBC.She was treated with fentanyl and sublingual nitroglycerin, and IV fluid bolus. Subsequently placed on nitro drip due to persistent chest pain. Admitted to stepdown unit due to low blood pressures, cardiology consulted.  Underwent cardiac cath on 3/15 which showed significant stenosis of graft to obtuse marginal 2, stent placed.  EF on cath 40% with inferior apical hypokinesis.  NSTEMI Coronary artery disease status post four-vessel CABG Chronic systolic CHF-EF 45 to 50% on echo 3/13.    Left heart cath on 3/15 showed significant stenosis of graft and obtuse marginal 2, stent placed. --Cardiology following --Continue heparin drip --Continue aspirin Plavix, Toprol-XL, lisinopril, Lipitor --Imdur for chest pain --Start cardiac rehab  Essential hypertension-hypotensive  on admission and continues intermittent borderline blood pressures. --Continue on lisinopril and metoprolol as above,hold if MAP less than 65 --Maintain MAP greater than 65 with IV fluids or pressors as needed  COPD-not acutely exacerbated.  --Continue home albuterol nebs every 6 hours as needed  Hyperlipidemia-continue Lipitor  Ongoing tobacco abuse- --patient counseled of need for complete smoking cessation for cardiovascular health --We will offer nicotine patches on discharge --PCP follow-up  Discharge Diagnoses: Active Problems:   NSTEMI (non-ST elevated myocardial infarction) [facility])    Discharge Instructions   Discharge Instructions    AMB Referral to Cardiac Rehabilitation - Phase II   Complete by: As directed    Diagnosis:  NSTEMI Coronary Stents     After initial evaluation and assessments completed: Virtual Based Care may be provided alone or in conjunction with Phase 2 Cardiac Rehab based on patient barriers.: Yes   Call MD for:  extreme fatigue   Complete by: As directed    Call MD for:  temperature >100.4   Complete by: As directed    Diet - low sodium heart healthy   Complete by: As directed    Discharge instructions   Complete by: As directed    Follow up with cardiology in 2 weeks.   Increase activity slowly   Complete by: As directed      Allergies as of 12/08/2019      Reactions   Trazodone And Nefazodone Palpitations      Medication List    STOP taking these medications   isosorbide mononitrate 30 MG 24 hr tablet Commonly known as: IMDUR     TAKE these medications   albuterol 108 (90 Base) MCG/ACT inhaler Commonly known as: VENTOLIN HFA Inhale into the lungs every 6 (six) hours as needed for wheezing or shortness of breath.  aspirin 81 MG tablet Take 81 mg by mouth daily.   atorvastatin 40 MG tablet Commonly known as: LIPITOR Take 40 mg by mouth at bedtime.   clopidogrel 75 MG tablet Commonly known as: PLAVIX Take 75 mg by  mouth daily.   Fluticasone-Salmeterol 250-50 MCG/DOSE Aepb Commonly known as: ADVAIR Inhale 1 puff into the lungs 2 (two) times daily.   lisinopril 5 MG tablet Commonly known as: ZESTRIL Take 2.5 mg by mouth daily.   metoprolol succinate 25 MG 24 hr tablet Commonly known as: TOPROL-XL Take 25 mg by mouth 2 (two) times daily.      Follow-up Information    [facility]. Schedule an appointment as soon as possible for a visit in 1 week(s).   Specialty: General Practice Contact information: [address] [place] [address] [phone]        Lamar Blinks, MD. Schedule an appointment as soon as possible for a visit in 2 week(s).   Specialty: Cardiology Contact information: [address] [facility] [place] [address] [phone]          Allergies  Allergen Reactions  . Trazodone And Nefazodone Palpitations    Consultations:  cardiology    Procedures/Studies: CT Angio Chest PE W and/or Wo Contrast  Result Date: 12/05/2019 CLINICAL DATA:  Left chest pain EXAM: CT ANGIOGRAPHY CHEST WITH CONTRAST TECHNIQUE: Multidetector CT imaging of the chest was performed using the standard protocol during bolus administration of intravenous contrast. Multiplanar CT image reconstructions and MIPs were obtained to evaluate the vascular anatomy. CONTRAST:  75mL OMNIPAQUE IOHEXOL 350 MG/ML SOLN COMPARISON:  Chest radiograph dated 12/05/2019 FINDINGS: Cardiovascular: Satisfactory opacification of the bilateral pulmonary arteries to the segmental level. No evidence of pulmonary embolism. Although not tailored for evaluation of the thoracic aorta, there is no evidence of thoracic aortic aneurysm or dissection. Atherosclerotic calcifications of the aortic arch. The heart is normal in size.  No pericardial effusion. Three vessel coronary atherosclerosis. Postsurgical changes related to prior CABG. Mediastinum/Nodes: No suspicious  mediastinal lymphadenopathy. Visualized thyroid is unremarkable. Lungs/Pleura: Mild centrilobular and paraseptal emphysematous changes, upper lung predominant. Platelike scarring in the left upper lobe. Prominent epicardial fat pad along the left heart border, likely accounting for the radiographic abnormality. No focal consolidation. Evaluation of the lung parenchyma is constrained by mild respiratory motion. Within that constraint, there are no suspicious pulmonary nodules. No pleural effusion or pneumothorax. Upper Abdomen: Visualized upper abdomen is grossly unremarkable, noting vascular calcifications. Musculoskeletal: Mild degenerative changes of the visualized thoracolumbar spine. 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 Review of the MIP images confirms the above findings. IMPRESSION: No evidence of pulmonary embolism. Prominent epicardial fat pad along the left heart border, likely accounting for the radiographic abnormality. Mild scarring in the left upper lobe. No evidence of acute cardiopulmonary disease. Aortic Atherosclerosis (ICD10-I70.0) and Emphysema (ICD10-J43.9). Electronically Signed   By: Charline Bills M.D.   On: 12/05/2019 04:23   CARDIAC CATHETERIZATION  Result Date: 12/07/2019  Prox Graft lesion is 95% stenosed.  A drug-eluting stent was successfully placed using a STENT RESOLUTE ONYX 3.5X18.  Post intervention, there is a 0% residual stenosis.  1.  Successful PCI with DES SVG to OM 2   CARDIAC CATHETERIZATION  Result Date: 12/07/2019  Ost RCA lesion is 100% stenosed.  Prox Cx lesion is 100% stenosed.  Ost LAD to Prox LAD lesion is 100% stenosed.  Prox Graft to Mid Graft lesion is 20% stenosed.  Prox Graft to Mid Graft lesion is 20% stenosed.  Dist Graft lesion is 45% stenosed.  Mid Graft lesion is 99% stenosed.  Dist RCA lesion is 100% stenosed.  Mid Cx lesion is 100% stenosed.  59 year old female with hypertension hyperlipidemia  previous coronary bypass graft with previous stenting of grafts having a non-ST elevation myocardial infarction Left ventricle showing mild to moderate LV systolic dysfunction with ejection fraction of 35 to 40% with inferior hypokinesis Right coronary artery circumflex artery left anterior descending artery occluded LIMA graft to LAD patent SVG graft to D1 patent SVG graft to right coronary artery with stent patent with minimal atherosclerosis SVG graft to obtuse marginal 2 with patent stent but 99% distal stenosis Plan PCI and stent placement of graft to obtuse marginal 2 Dual antiplatelet therapy High intensity cholesterol therapy Beta-blocker ACE inhibitor for myocardial infarction LV systolic dysfunction Begin cardiac rehabilitation   DG Chest Portable 1 View  Result Date: 12/05/2019 CLINICAL DATA:  59 year old female with chest pain. EXAM: PORTABLE CHEST 1 VIEW COMPARISON:  Chest radiograph dated 02/19/2013 FINDINGS: There is slight silhouetting of the left cardiac border. Developing infiltrate in lingula is not excluded. Clinical correlation is recommended. There is no pleural effusion or pneumothorax. The cardiac silhouette is within normal limits. Median sternotomy wires and CABG vascular clips. Atherosclerotic calcification of the aorta. No acute osseous pathology. IMPRESSION: Slight silhouetting of the left cardiac border similar to prior radiograph which may be chronic and related to prominent pericardial fat pad. Developing infiltrate in the lingula is not excluded. Clinical correlation is recommended. Electronically Signed   By: Anner Crete M.D.   On: 12/05/2019 02:14   ECHOCARDIOGRAM COMPLETE  Result Date: 12/06/2019    ECHOCARDIOGRAM REPORT   Patient Name:   Crystal [name] Date of Exam: 12/05/2019 Medical Rec #:  [id number]      Height:       60.0 in Accession #:    [id number]     Weight:       125.4 lb Date of Birth:  11-22-1960      BSA:          1.531 m Patient Age:    30 years        BP:           128/75 mmHg Patient Gender: F              HR:           92 bpm. Exam Location:  [facility] Procedure: 2D Echo and Intracardiac Opacification Agent Indications:     Elevated Troponin  History:         Patient has no prior history of Echocardiogram examinations.                  Prior CABG, COPD; Risk Factors:Hypertension.  Sonographer:     LTM Referring Phys:  [id number] Taylor Diagnosing Phys: Serafina Royals MD IMPRESSIONS  1. Left ventricular ejection fraction, by estimation, is 45 to 50%. The left ventricle has mildly decreased function. The left ventricle demonstrates regional wall motion abnormalities (see scoring diagram/findings for description). Left ventricular diastolic parameters were normal.  2. Right ventricular systolic function is normal. The right ventricular size is normal. There is mildly elevated pulmonary artery systolic pressure.  3. The mitral valve is normal in structure. Mild mitral valve regurgitation.  4. The aortic valve is normal in structure. Aortic valve regurgitation is not visualized. FINDINGS  Left Ventricle: Left ventricular ejection fraction, by  estimation, is 45 to 50%. The left ventricle has mildly decreased function. The left ventricle demonstrates regional wall motion abnormalities. Definity contrast agent was given IV to delineate the left ventricular endocardial borders. The left ventricular internal cavity size was normal in size. There is no left ventricular hypertrophy. Left ventricular diastolic parameters were normal. Right Ventricle: The right ventricular size is normal. No increase in right ventricular wall thickness. Right ventricular systolic function is normal. There is mildly elevated pulmonary artery systolic pressure. The tricuspid regurgitant velocity is 2.25  m/s, and with an assumed right atrial pressure of 10 mmHg, the estimated right ventricular systolic pressure is 30.2 mmHg. Left Atrium: Left atrial size was normal in size. Right Atrium: Right  atrial size was normal in size. Pericardium: There is no evidence of pericardial effusion. Mitral Valve: The mitral valve is normal in structure. Mild mitral valve regurgitation. Tricuspid Valve: The tricuspid valve is normal in structure. Tricuspid valve regurgitation is trivial. Aortic Valve: The aortic valve is normal in structure. Aortic valve regurgitation is not visualized. Aortic valve mean gradient measures 3.0 mmHg. Aortic valve peak gradient measures 5.4 mmHg. Aortic valve area, by VTI measures 1.64 cm. Pulmonic Valve: The pulmonic valve was normal in structure. Pulmonic valve regurgitation is not visualized. Aorta: The aortic root and ascending aorta are structurally normal, with no evidence of dilitation. IAS/Shunts: No atrial level shunt detected by color flow Doppler.  LEFT VENTRICLE PLAX 2D LVIDd:         4.14 cm     Diastology LVIDs:         3.22 cm     LV e' lateral:   5.77 cm/s LV PW:         0.95 cm     LV E/e' lateral: 20.3 LV IVS:        0.72 cm     LV e' medial:    6.42 cm/s LVOT diam:     1.90 cm     LV E/e' medial:  18.2 LV SV:         41 LV SV Index:   27 LVOT Area:     2.84 cm  LV Volumes (MOD) LV vol d, MOD A2C: 79.8 ml LV vol d, MOD A4C: 75.7 ml LV vol s, MOD A2C: 32.9 ml LV vol s, MOD A4C: 42.3 ml LV SV MOD A2C:     46.9 ml LV SV MOD A4C:     75.7 ml LV SV MOD BP:      41.5 ml RIGHT VENTRICLE RV S prime:     6.64 cm/s TAPSE (M-mode): 1.7 cm LEFT ATRIUM             Index LA diam:        3.00 cm 1.96 cm/m LA Vol (A2C):   27.9 ml 18.22 ml/m LA Vol (A4C):   31.5 ml 20.58 ml/m LA Biplane Vol: 30.0 ml 19.60 ml/m  AORTIC VALVE AV Area (Vmax):    2.00 cm AV Area (Vmean):   1.64 cm AV Area (VTI):     1.64 cm AV Vmax:           116.00 cm/s AV Vmean:          77.500 cm/s AV VTI:            0.253 m AV Peak Grad:      5.4 mmHg AV Mean Grad:      3.0 mmHg LVOT Vmax:  82.00 cm/s LVOT Vmean:        44.900 cm/s LVOT VTI:          0.146 m LVOT/AV VTI ratio: 0.58  AORTA Ao Root diam: 3.00  cm MITRAL VALVE                TRICUSPID VALVE MV Area (PHT): 4.78 cm     TR Peak grad:   20.2 mmHg MV E velocity: 117.00 cm/s  TR Vmax:        225.00 cm/s MV A velocity: 74.20 cm/s MV E/A ratio:  1.58         SHUNTS                             Systemic VTI:  0.15 m                             Systemic Diam: 1.90 cm Arnoldo Hooker MD Electronically signed by Arnoldo Hooker MD Signature Date/Time: 12/06/2019/7:26:22 AM    Final       Echo-EF 45 to 50%  Left heart cath   Subjective: Patient reports feeling well.  No chest pain or SOB/DOE.  No other new complaints or acute events.     Discharge Exam: Vitals:   12/08/19 1156 12/08/19 1227  BP: (!) 95/48 100/63  Pulse: 73 73  Resp: 16   Temp: 98.2 F (36.8 C)   SpO2: (!) 81% 98%   Vitals:   12/08/19 0400 12/08/19 0747 12/08/19 1156 12/08/19 1227  BP: (!) 123/59 117/66 (!) 95/48 100/63  Pulse: 66 82 73 73  Resp: 20  16   Temp: 98.1 F (36.7 C) 97.7 F (36.5 C) 98.2 F (36.8 C)   TempSrc: Oral Oral    SpO2: 95% 96% (!) 81% 98%  Weight:      Height:        General: Pt is alert, awake, not in acute distress Cardiovascular: RRR, S1/S2 +, no rubs, no gallops Respiratory: CTA bilaterally, no wheezing, no rhonchi Abdominal: Soft, NT, ND, bowel sounds + Extremities: no edema, no cyanosis    The results of significant diagnostics from this hospitalization (including imaging, microbiology, ancillary and laboratory) are listed below for reference.     Microbiology: Recent Results (from the past 240 hour(s))  Respiratory Panel by RT PCR (Flu A&B, Covid) - Nasopharyngeal Swab     Status: None   Collection Time: 12/05/19  5:24 AM   Specimen: Nasopharyngeal Swab  Result Value Ref Range Status   SARS Coronavirus 2 by RT PCR NEGATIVE NEGATIVE Final    Comment: (NOTE) SARS-CoV-2 target nucleic acids are NOT DETECTED. The SARS-CoV-2 RNA is generally detectable in upper respiratoy specimens during the acute phase of infection. The  lowest concentration of SARS-CoV-2 viral copies this assay can detect is 131 copies/mL. A negative result does not preclude SARS-Cov-2 infection and should not be used as the sole basis for treatment or other patient management decisions. A negative result may occur with  improper specimen collection/handling, submission of specimen other than nasopharyngeal swab, presence of viral mutation(s) within the areas targeted by this assay, and inadequate number of viral copies (<131 copies/mL). A negative result must be combined with clinical observations, patient history, and epidemiological information. The expected result is Negative. Fact Sheet for Patients:  https://www.moore.com/ Fact Sheet for Healthcare Providers:  https://www.young.biz/ This test is not yet ap proved  or cleared by the [place] and  has been authorized for detection and/or diagnosis of SARS-CoV-2 by FDA under an Emergency Use Authorization (EUA). This EUA will remain  in effect (meaning this test can be used) for the duration of the COVID-19 declaration under Section 564(b)(1) of the Act, 21 U.S.C. section 360bbb-3(b)(1), unless the authorization is terminated or revoked sooner.    Influenza A by PCR NEGATIVE NEGATIVE Final   Influenza B by PCR NEGATIVE NEGATIVE Final    Comment: (NOTE) The Xpert Xpress SARS-CoV-2/FLU/RSV assay is intended as an aid in  the diagnosis of influenza from Nasopharyngeal swab specimens and  should not be used as a sole basis for treatment. Nasal washings and  aspirates are unacceptable for Xpert Xpress SARS-CoV-2/FLU/RSV  testing. Fact Sheet for Patients: https://www.moore.com/ Fact Sheet for Healthcare Providers: https://www.young.biz/ This test is not yet approved or cleared by the [place] FDA and  has been authorized for detection and/or diagnosis of SARS-CoV-2 by  FDA under an Emergency  Use Authorization (EUA). This EUA will remain  in effect (meaning this test can be used) for the duration of the  Covid-19 declaration under Section 564(b)(1) of the Act, 21  U.S.C. section 360bbb-3(b)(1), unless the authorization is  terminated or revoked. Performed at [facility], [address] [place], [place] [address]      Labs: BNP (last 3 results) No results for input(s): BNP in the last 8760 hours. Basic Metabolic Panel: Recent Labs  Lab 12/05/19 0142 12/06/19 0320 12/08/19 0626  NA 135 140 139  K 3.5 3.8 3.5  CL 99 107 104  CO2 26 26 25   GLUCOSE 157* 95 105*  BUN 7 6 7   CREATININE 0.67 0.65 0.66  CALCIUM 9.7 9.1 9.1   Liver Function Tests: Recent Labs  Lab 12/05/19 0142  AST 23  ALT 24  ALKPHOS 62  BILITOT 0.3  PROT 7.7  ALBUMIN 4.4   No results for input(s): LIPASE, AMYLASE in the last 168 hours. No results for input(s): AMMONIA in the last 168 hours. CBC: Recent Labs  Lab 12/05/19 0142 12/06/19 0520 12/07/19 0501 12/08/19 0626  WBC 7.1 7.9 7.0 8.4  NEUTROABS 3.7  --   --   --   HGB 13.1 12.6 11.4* 11.5*  HCT 38.9 37.8 34.1* 34.5*  MCV 87.4 88.3 88.3 88.0  PLT 359 324 289 299   Cardiac Enzymes: No results for input(s): CKTOTAL, CKMB, CKMBINDEX, TROPONINI in the last 168 hours. BNP: Invalid input(s): POCBNP CBG: Recent Labs  Lab 12/05/19 0559  GLUCAP 124*   D-Dimer No results for input(s): DDIMER in the last 72 hours. Hgb A1c No results for input(s): HGBA1C in the last 72 hours. Lipid Profile No results for input(s): CHOL, HDL, LDLCALC, TRIG, CHOLHDL, LDLDIRECT in the last 72 hours. Thyroid function studies No results for input(s): TSH, T4TOTAL, T3FREE, THYROIDAB in the last 72 hours.  Invalid input(s): FREET3 Anemia work up No results for input(s): VITAMINB12, FOLATE, FERRITIN, TIBC, IRON, RETICCTPCT in the last 72 hours. Urinalysis No results found for: COLORURINE, APPEARANCEUR, LABSPEC, PHURINE, GLUCOSEU, HGBUR,  BILIRUBINUR, KETONESUR, PROTEINUR, UROBILINOGEN, NITRITE, LEUKOCYTESUR Sepsis Labs Invalid input(s): PROCALCITONIN,  WBC,  LACTICIDVEN Microbiology Recent Results (from the past 240 hour(s))  Respiratory Panel by RT PCR (Flu A&B, Covid) - Nasopharyngeal Swab     Status: None   Collection Time: 12/05/19  5:24 AM   Specimen: Nasopharyngeal Swab  Result Value Ref Range Status   SARS Coronavirus 2 by RT  PCR NEGATIVE NEGATIVE Final    Comment: (NOTE) SARS-CoV-2 target nucleic acids are NOT DETECTED. The SARS-CoV-2 RNA is generally detectable in upper respiratoy specimens during the acute phase of infection. The lowest concentration of SARS-CoV-2 viral copies this assay can detect is 131 copies/mL. A negative result does not preclude SARS-Cov-2 infection and should not be used as the sole basis for treatment or other patient management decisions. A negative result may occur with  improper specimen collection/handling, submission of specimen other than nasopharyngeal swab, presence of viral mutation(s) within the areas targeted by this assay, and inadequate number of viral copies (<131 copies/mL). A negative result must be combined with clinical observations, patient history, and epidemiological information. The expected result is Negative. Fact Sheet for Patients:  https://www.moore.com/https://www.fda.gov/media/142436/download Fact Sheet for Healthcare Providers:  https://www.young.biz/https://www.fda.gov/media/142435/download This test is not yet ap proved or cleared by the [place] FDA and  has been authorized for detection and/or diagnosis of SARS-CoV-2 by FDA under an Emergency Use Authorization (EUA). This EUA will remain  in effect (meaning this test can be used) for the duration of the COVID-19 declaration under Section 564(b)(1) of the Act, 21 U.S.C. section 360bbb-3(b)(1), unless the authorization is terminated or revoked sooner.    Influenza A by PCR NEGATIVE NEGATIVE Final   Influenza B by PCR NEGATIVE NEGATIVE  Final    Comment: (NOTE) The Xpert Xpress SARS-CoV-2/FLU/RSV assay is intended as an aid in  the diagnosis of influenza from Nasopharyngeal swab specimens and  should not be used as a sole basis for treatment. Nasal washings and  aspirates are unacceptable for Xpert Xpress SARS-CoV-2/FLU/RSV  testing. Fact Sheet for Patients: https://www.moore.com/https://www.fda.gov/media/142436/download Fact Sheet for Healthcare Providers: https://www.young.biz/https://www.fda.gov/media/142435/download This test is not yet approved or cleared by the [place] FDA and  has been authorized for detection and/or diagnosis of SARS-CoV-2 by  FDA under an Emergency Use Authorization (EUA). This EUA will remain  in effect (meaning this test can be used) for the duration of the  Covid-19 declaration under Section 564(b)(1) of the Act, 21  U.S.C. section 360bbb-3(b)(1), unless the authorization is  terminated or revoked. Performed at [facility], [address] [place], [place] [id number]      Time coordinating discharge: Over 30 minutes  SIGNED:   Pennie BanterKelly A Aryeh Butterfield, DO [facility] 12/08/2019, 12:29 PM   If 7PM-7AM, please contact night-coverage [email or web address]

## 2019-12-08 NOTE — Plan of Care (Signed)

## 2019-12-08 NOTE — Progress Notes (Signed)
[facility] Cardiology [facility] Encounter Note  Patient: Crystal [name] / Admit Date: 12/05/2019 / Date of Encounter: 12/08/2019, 8:51 AM   Subjective: Patient feeling much better today.  No evidence of significant chest discomfort or congestive heart failure.  Patient's telemetry is stable.  Elevation of troponin at 3557 consistent with non-ST elevation myocardial infarction. Echocardiogram showing mild apical akinesis consistent with myocardial infarction with ejection fraction of 50% Cardiac catheterization showing inferior apical hypokinesis with ejection fraction of 40% Occluded native coronary arteries Patent graft to LAD, right coronary artery, diagonal, Significant stenosis of graft to obtuse marginal 2 as culprit artery stenosis causing non-ST elevation myocardial infarction Status post PCI and stent placement of graft to obtuse marginal artery stenosis with success and no complication Review of Systems: Positive for: None Negative for: Vision change, hearing change, syncope, dizziness, nausea, vomiting,diarrhea, bloody stool, stomach pain, cough, congestion, diaphoresis, urinary frequency, urinary pain,skin lesions, skin rashes Others previously listed  Objective: Telemetry: Normal sinus rhythm Physical Exam: Blood pressure 117/66, pulse 82, temperature 97.7 F (36.5 C), temperature source Oral, resp. rate 20, height 5' (1.524 m), weight 56.2 kg, SpO2 96 %. Body mass index is 24.22 kg/m. General: Well developed, well nourished, in no acute distress. Head: Normocephalic, atraumatic, sclera non-icteric, no xanthomas, nares are without discharge. Neck: No apparent masses Lungs: Normal respirations with no wheezes, no rhonchi, no rales , no crackles   Heart: Regular rate and rhythm, normal S1 S2, no murmur, no rub, no gallop, PMI is normal size and placement, carotid upstroke normal without bruit, jugular venous pressure normal Abdomen: Soft, non-tender, non-distended with  normoactive bowel sounds. No hepatosplenomegaly. Abdominal aorta is normal size without bruit Extremities: No edema, no clubbing, no cyanosis, no ulcers,  Peripheral: 2+ radial, 2+ femoral, 2+ dorsal pedal pulses Neuro: Alert and oriented. Moves all extremities spontaneously. Psych:  Responds to questions appropriately with a normal affect.   Intake/Output Summary (Last 24 hours) at 12/08/2019 0851 Last data filed at 12/08/2019 0828 Gross per 24 hour  Intake 719.3 ml  Output --  Net 719.3 ml    Inpatient Medications:  . aspirin EC  81 mg Oral Daily  . atorvastatin  40 mg Oral QHS  . Chlorhexidine Gluconate Cloth  6 each Topical Daily  . clopidogrel  75 mg Oral Q breakfast  . isosorbide mononitrate  30 mg Oral Daily  . lisinopril  2.5 mg Oral Daily  . metoprolol succinate  25 mg Oral BID  . sodium chloride flush  3 mL Intravenous Q12H  . sodium chloride flush  3 mL Intravenous Q12H   Infusions:  . sodium chloride    . nitroGLYCERIN Stopped (12/05/19 1500)    Labs: Recent Labs    12/06/19 0320 12/08/19 0626  NA 140 139  K 3.8 3.5  CL 107 104  CO2 26 25  GLUCOSE 95 105*  BUN 6 7  CREATININE 0.65 0.66  CALCIUM 9.1 9.1   No results for input(s): AST, ALT, ALKPHOS, BILITOT, PROT, ALBUMIN in the last 72 hours. Recent Labs    12/07/19 0501 12/08/19 0626  WBC 7.0 8.4  HGB 11.4* 11.5*  HCT 34.1* 34.5*  MCV 88.3 88.0  PLT 289 299   No results for input(s): CKTOTAL, CKMB, TROPONINI in the last 72 hours. Invalid input(s): POCBNP No results for input(s): HGBA1C in the last 72 hours.   Weights: Filed Weights   12/05/19 0149 12/05/19 0605 12/07/19 0714  Weight: 56.2 kg 56.9 kg 56.2 kg  Radiology/Studies:  CT Angio Chest PE W and/or Wo Contrast  Result Date: 12/05/2019 CLINICAL DATA:  Left chest pain EXAM: CT ANGIOGRAPHY CHEST WITH CONTRAST TECHNIQUE: Multidetector CT imaging of the chest was performed using the standard protocol during bolus administration of  intravenous contrast. Multiplanar CT image reconstructions and MIPs were obtained to evaluate the vascular anatomy. CONTRAST:  68mL OMNIPAQUE IOHEXOL 350 MG/ML SOLN COMPARISON:  Chest radiograph dated 12/05/2019 FINDINGS: Cardiovascular: Satisfactory opacification of the bilateral pulmonary arteries to the segmental level. No evidence of pulmonary embolism. Although not tailored for evaluation of the thoracic aorta, there is no evidence of thoracic aortic aneurysm or dissection. Atherosclerotic calcifications of the aortic arch. The heart is normal in size.  No pericardial effusion. Three vessel coronary atherosclerosis. Postsurgical changes related to prior CABG. Mediastinum/Nodes: No suspicious mediastinal lymphadenopathy. Visualized thyroid is unremarkable. Lungs/Pleura: Mild centrilobular and paraseptal emphysematous changes, upper lung predominant. Platelike scarring in the left upper lobe. Prominent epicardial fat pad along the left heart border, likely accounting for the radiographic abnormality. No focal consolidation. Evaluation of the lung parenchyma is constrained by mild respiratory motion. Within that constraint, there are no suspicious pulmonary nodules. No pleural effusion or pneumothorax. Upper Abdomen: Visualized upper abdomen is grossly unremarkable, noting vascular calcifications. Musculoskeletal: Mild degenerative changes of the visualized thoracolumbar spine. 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 Review of the MIP images confirms the above findings. IMPRESSION: No evidence of pulmonary embolism. Prominent epicardial fat pad along the left heart border, likely accounting for the radiographic abnormality. Mild scarring in the left upper lobe. No evidence of acute cardiopulmonary disease. Aortic Atherosclerosis (ICD10-I70.0) and Emphysema (ICD10-J43.9). Electronically Signed   By: Julian Hy M.D.   On: 12/05/2019 04:23   CARDIAC  CATHETERIZATION  Result Date: 12/07/2019  Prox Graft lesion is 95% stenosed.  A drug-eluting stent was successfully placed using a STENT RESOLUTE ONYX 3.5X18.  Post intervention, there is a 0% residual stenosis.  1.  Successful PCI with DES SVG to OM 2   CARDIAC CATHETERIZATION  Result Date: 12/07/2019  Ost RCA lesion is 100% stenosed.  Prox Cx lesion is 100% stenosed.  Ost LAD to Prox LAD lesion is 100% stenosed.  Prox Graft to Mid Graft lesion is 20% stenosed.  Prox Graft to Mid Graft lesion is 20% stenosed.  Dist Graft lesion is 45% stenosed.  Mid Graft lesion is 99% stenosed.  Dist RCA lesion is 100% stenosed.  Mid Cx lesion is 100% stenosed.  59 year old female with hypertension hyperlipidemia previous coronary bypass graft with previous stenting of grafts having a non-ST elevation myocardial infarction Left ventricle showing mild to moderate LV systolic dysfunction with ejection fraction of 35 to 40% with inferior hypokinesis Right coronary artery circumflex artery left anterior descending artery occluded LIMA graft to LAD patent SVG graft to D1 patent SVG graft to right coronary artery with stent patent with minimal atherosclerosis SVG graft to obtuse marginal 2 with patent stent but 99% distal stenosis Plan PCI and stent placement of graft to obtuse marginal 2 Dual antiplatelet therapy High intensity cholesterol therapy Beta-blocker ACE inhibitor for myocardial infarction LV systolic dysfunction Begin cardiac rehabilitation   DG Chest Portable 1 View  Result Date: 12/05/2019 CLINICAL DATA:  59 year old female with chest pain. EXAM: PORTABLE CHEST 1 VIEW COMPARISON:  Chest radiograph dated 02/19/2013 FINDINGS: There is slight silhouetting of the left cardiac border. Developing infiltrate in lingula is not excluded. Clinical correlation is recommended. There is no pleural effusion or pneumothorax. The  cardiac silhouette is within normal limits. Median sternotomy wires and CABG vascular  clips. Atherosclerotic calcification of the aorta. No acute osseous pathology. IMPRESSION: Slight silhouetting of the left cardiac border similar to prior radiograph which may be chronic and related to prominent pericardial fat pad. Developing infiltrate in the lingula is not excluded. Clinical correlation is recommended. Electronically Signed   By: Elgie Collard M.D.   On: 12/05/2019 02:14   ECHOCARDIOGRAM COMPLETE  Result Date: 12/06/2019    ECHOCARDIOGRAM REPORT   Patient Name:   Crystal [name] Date of Exam: 12/05/2019 Medical Rec #:  [id number]      Height:       60.0 in Accession #:    [id number]     Weight:       125.4 lb Date of Birth:  10-Feb-1961      BSA:          1.531 m Patient Age:    58 years       BP:           128/75 mmHg Patient Gender: F              HR:           92 bpm. Exam Location:  [facility] Procedure: 2D Echo and Intracardiac Opacification Agent Indications:     Elevated Troponin  History:         Patient has no prior history of Echocardiogram examinations.                  Prior CABG, COPD; Risk Factors:Hypertension.  Sonographer:     LTM Referring Phys:  [id number] Vernetta Honey MANSY Diagnosing Phys: Arnoldo Hooker MD IMPRESSIONS  1. Left ventricular ejection fraction, by estimation, is 45 to 50%. The left ventricle has mildly decreased function. The left ventricle demonstrates regional wall motion abnormalities (see scoring diagram/findings for description). Left ventricular diastolic parameters were normal.  2. Right ventricular systolic function is normal. The right ventricular size is normal. There is mildly elevated pulmonary artery systolic pressure.  3. The mitral valve is normal in structure. Mild mitral valve regurgitation.  4. The aortic valve is normal in structure. Aortic valve regurgitation is not visualized. FINDINGS  Left Ventricle: Left ventricular ejection fraction, by estimation, is 45 to 50%. The left ventricle has mildly decreased function. The left ventricle demonstrates regional  wall motion abnormalities. Definity contrast agent was given IV to delineate the left ventricular endocardial borders. The left ventricular internal cavity size was normal in size. There is no left ventricular hypertrophy. Left ventricular diastolic parameters were normal. Right Ventricle: The right ventricular size is normal. No increase in right ventricular wall thickness. Right ventricular systolic function is normal. There is mildly elevated pulmonary artery systolic pressure. The tricuspid regurgitant velocity is 2.25  m/s, and with an assumed right atrial pressure of 10 mmHg, the estimated right ventricular systolic pressure is 30.2 mmHg. Left Atrium: Left atrial size was normal in size. Right Atrium: Right atrial size was normal in size. Pericardium: There is no evidence of pericardial effusion. Mitral Valve: The mitral valve is normal in structure. Mild mitral valve regurgitation. Tricuspid Valve: The tricuspid valve is normal in structure. Tricuspid valve regurgitation is trivial. Aortic Valve: The aortic valve is normal in structure. Aortic valve regurgitation is not visualized. Aortic valve mean gradient measures 3.0 mmHg. Aortic valve peak gradient measures 5.4 mmHg. Aortic valve area, by VTI measures 1.64 cm. Pulmonic Valve: The pulmonic valve was normal in structure. Pulmonic  valve regurgitation is not visualized. Aorta: The aortic root and ascending aorta are structurally normal, with no evidence of dilitation. IAS/Shunts: No atrial level shunt detected by color flow Doppler.  LEFT VENTRICLE PLAX 2D LVIDd:         4.14 cm     Diastology LVIDs:         3.22 cm     LV e' lateral:   5.77 cm/s LV PW:         0.95 cm     LV E/e' lateral: 20.3 LV IVS:        0.72 cm     LV e' medial:    6.42 cm/s LVOT diam:     1.90 cm     LV E/e' medial:  18.2 LV SV:         41 LV SV Index:   27 LVOT Area:     2.84 cm  LV Volumes (MOD) LV vol d, MOD A2C: 79.8 ml LV vol d, MOD A4C: 75.7 ml LV vol s, MOD A2C: 32.9 ml LV  vol s, MOD A4C: 42.3 ml LV SV MOD A2C:     46.9 ml LV SV MOD A4C:     75.7 ml LV SV MOD BP:      41.5 ml RIGHT VENTRICLE RV S prime:     6.64 cm/s TAPSE (M-mode): 1.7 cm LEFT ATRIUM             Index LA diam:        3.00 cm 1.96 cm/m LA Vol (A2C):   27.9 ml 18.22 ml/m LA Vol (A4C):   31.5 ml 20.58 ml/m LA Biplane Vol: 30.0 ml 19.60 ml/m  AORTIC VALVE AV Area (Vmax):    2.00 cm AV Area (Vmean):   1.64 cm AV Area (VTI):     1.64 cm AV Vmax:           116.00 cm/s AV Vmean:          77.500 cm/s AV VTI:            0.253 m AV Peak Grad:      5.4 mmHg AV Mean Grad:      3.0 mmHg LVOT Vmax:         82.00 cm/s LVOT Vmean:        44.900 cm/s LVOT VTI:          0.146 m LVOT/AV VTI ratio: 0.58  AORTA Ao Root diam: 3.00 cm MITRAL VALVE                TRICUSPID VALVE MV Area (PHT): 4.78 cm     TR Peak grad:   20.2 mmHg MV E velocity: 117.00 cm/s  TR Vmax:        225.00 cm/s MV A velocity: 74.20 cm/s MV E/A ratio:  1.58         SHUNTS                             Systemic VTI:  0.15 m                             Systemic Diam: 1.90 cm Arnoldo HookerBruce Sakai Wolford MD Electronically signed by Arnoldo HookerBruce Dalaysia Harms MD Signature Date/Time: 12/06/2019/7:26:22 AM    Final      Assessment and Recommendation  59 y.o. female 59 year old female with known coronary disease status post coronary  bypass graft hypertension hyperlipidemia previous myocardial infarction with non-ST elevation myocardial infarction With new significant stenosis of 99% of graft to obtuse marginal 2 causing above Now status post PCI and stent placement of graft to obtuse marginal with success and no complication 1.  Dual antiplatelet therapy for PCI and stent placement and non-ST elevation myocardial infarction 2.  Okay for discontinuation of isosorbide due to PCI and stent placement and relief of chest discomfort 3.  Metoprolol lisinopril for myocardial infarction and LV systolic dysfunction 4.  High intensity cholesterol therapy 5.  Begin ambulation and follow for  improvements of symptoms 6.  Okay for discharge home from cardiac standpoint with follow-up in next 2 weeks for further adjustments of medication management signed, Arnoldo Hooker M.D. FACC

## 2019-12-08 NOTE — Progress Notes (Signed)
59 year old female with NSTEMI admitted to stepdown unit due to low blood pressures, cardiology consulted.  Underwent cardiac cath on 3/15 which showed significant stenosis of graft to obtuse marginal 2, stent placed.  EF on cath 40% with inferior apical hypokinesis. Post procedure she has been hemodynamically stable, SR on monitor, and chest pain free. She denies shortness of breath.  She Is no long on heparin iv infusion.  She is off bedrest restrictions from post cath procedure. Cath site right groin with dressing and compression/venous plug device in place. NO hematoma. NV checks good. She has been OOB to bathroom and sat in chair since prcedure.  She is stable to move to telemetry unit.

## 2019-12-08 NOTE — Progress Notes (Signed)
Patient given discharge instructions. Both IV's taken out and tele monitor off. Patient verbalized understanding. Verbalized she does have a blood pressure device at home and was instructed to monitor blood pressure closely.

## 2019-12-08 NOTE — Progress Notes (Signed)
Received Pt from ICU to room 237. Pt is awake, alert and oriented. Pt denies any pain at this time. placed on tele box 58. SR on the monitor. Pt oriented to room and call light. Pt denies any need at this time. Stated " I am going back to sleep". Call light at reach and bed to lowest position.

## 2020-02-06 ENCOUNTER — Other Ambulatory Visit: Payer: Self-pay

## 2020-02-06 ENCOUNTER — Ambulatory Visit
Admission: EM | Admit: 2020-02-06 | Discharge: 2020-02-06 | Disposition: A | Discharge status: Home / Self Care | Payer: Self-pay | Attending: Family Medicine | Admitting: Family Medicine

## 2020-02-06 ENCOUNTER — Encounter: Payer: Self-pay | Admitting: Emergency Medicine

## 2020-02-06 DIAGNOSIS — J441 Chronic obstructive pulmonary disease with (acute) exacerbation: Secondary | ICD-10-CM

## 2020-02-06 MED ORDER — DOXYCYCLINE HYCLATE 100 MG PO TABS: 100.0000 mg | ORAL_TABLET | Freq: Two times a day (BID) | ORAL | 0 refills | Status: DC

## 2020-02-06 MED ORDER — PREDNISONE 20 MG PO TABS: ORAL_TABLET | ORAL | 0 refills | Status: DC
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
## 2020-02-06 NOTE — Discharge Instructions (Signed)
Continue albuterol inhaler.

## 2020-02-06 NOTE — ED Triage Notes (Addendum)
Patient c/o head and chest congestion for 2 weeks.  Patient reports cough and SOB that started earlier this week.  Patient states that she has COPD.  Patient denies fevers. Patient dose not want a COVID test today.

## 2020-12-20 ENCOUNTER — Other Ambulatory Visit: Payer: Self-pay | Admitting: Pediatrics

## 2020-12-20 ENCOUNTER — Ambulatory Visit
Admission: RE | Admit: 2020-12-20 | Discharge: 2020-12-20 | Disposition: A | Discharge status: Home / Self Care | Payer: Disability Insurance | Attending: Pediatrics | Admitting: Pediatrics

## 2020-12-20 ENCOUNTER — Other Ambulatory Visit: Payer: Self-pay

## 2020-12-20 ENCOUNTER — Ambulatory Visit
Admission: RE | Admit: 2020-12-20 | Discharge: 2020-12-20 | Disposition: A | Discharge status: Home / Self Care | Payer: Disability Insurance | Source: Ambulatory Visit | Attending: Pediatrics | Admitting: Pediatrics

## 2020-12-20 DIAGNOSIS — M25572 Pain in left ankle and joints of left foot: Secondary | ICD-10-CM

## 2020-12-20 DIAGNOSIS — M25561 Pain in right knee: Secondary | ICD-10-CM

## 2020-12-20 DIAGNOSIS — M25562 Pain in left knee: Secondary | ICD-10-CM | POA: Insufficient documentation

## 2021-06-27 ENCOUNTER — Encounter: Payer: Self-pay | Admitting: Family Medicine

## 2021-07-18 ENCOUNTER — Other Ambulatory Visit: Payer: Self-pay | Admitting: *Deleted

## 2021-07-18 ENCOUNTER — Inpatient Hospital Stay
Admission: RE | Admit: 2021-07-18 | Discharge: 2021-07-18 | Disposition: A | Discharge status: Home / Self Care | Payer: Self-pay | Source: Ambulatory Visit | Attending: *Deleted | Admitting: *Deleted

## 2021-07-18 DIAGNOSIS — Z1231 Encounter for screening mammogram for malignant neoplasm of breast: Secondary | ICD-10-CM

## 2021-07-19 ENCOUNTER — Ambulatory Visit
Admission: RE | Admit: 2021-07-19 | Discharge: 2021-07-19 | Disposition: A | Discharge status: Home / Self Care | Payer: Self-pay | Source: Ambulatory Visit | Attending: Oncology | Admitting: Oncology

## 2021-07-19 ENCOUNTER — Ambulatory Visit: Payer: Disability Insurance | Attending: Oncology

## 2021-07-19 ENCOUNTER — Other Ambulatory Visit: Payer: Self-pay

## 2021-07-19 DIAGNOSIS — N63 Unspecified lump in unspecified breast: Secondary | ICD-10-CM

## 2021-07-19 NOTE — Progress Notes (Signed)
Patient pre-screened for BCCCP eligibility due to COVID 19 precautions. Two patient identifiers used for verification that I was speaking to correct patient.  Patient to Present directly to [facility] today for B[facility]CP diagnostic mammogram, as follow-up 2008 mammogram from [facility].  Crystal [name] reported outside films received.

## 2021-07-28 ENCOUNTER — Ambulatory Visit: Admission: RE | Admit: 2021-07-28 | Payer: Self-pay | Source: Ambulatory Visit

## 2021-07-28 ENCOUNTER — Other Ambulatory Visit: Payer: Self-pay

## 2021-07-28 ENCOUNTER — Other Ambulatory Visit: Payer: Self-pay | Admitting: Oncology

## 2021-07-28 ENCOUNTER — Ambulatory Visit
Admission: RE | Admit: 2021-07-28 | Discharge: 2021-07-28 | Disposition: A | Discharge status: Home / Self Care | Payer: Self-pay | Source: Ambulatory Visit | Attending: Oncology | Admitting: Oncology

## 2021-07-28 DIAGNOSIS — N63 Unspecified lump in unspecified breast: Secondary | ICD-10-CM

## 2021-07-31 ENCOUNTER — Other Ambulatory Visit: Payer: Self-pay

## 2021-07-31 DIAGNOSIS — N6320 Unspecified lump in the left breast, unspecified quadrant: Secondary | ICD-10-CM

## 2021-08-25 ENCOUNTER — Ambulatory Visit
Admission: RE | Admit: 2021-08-25 | Discharge: 2021-08-25 | Disposition: A | Discharge status: Home / Self Care | Payer: Self-pay | Source: Ambulatory Visit | Attending: Oncology | Admitting: Oncology

## 2021-08-25 ENCOUNTER — Other Ambulatory Visit: Payer: Self-pay

## 2021-08-25 DIAGNOSIS — N6320 Unspecified lump in the left breast, unspecified quadrant: Secondary | ICD-10-CM | POA: Insufficient documentation

## 2021-08-31 NOTE — Progress Notes (Signed)
Letter mailed from [facility] to notify of normal mammogram results.  Patient to return in one year for annual screening.  Copy to [facility]. 

## 2022-07-13 ENCOUNTER — Other Ambulatory Visit: Payer: Self-pay | Admitting: Family Medicine

## 2022-07-13 DIAGNOSIS — Z1231 Encounter for screening mammogram for malignant neoplasm of breast: Secondary | ICD-10-CM

## 2022-07-15 IMAGING — MG MM DIGITAL DIAGNOSTIC UNILAT*L* W/ TOMO W/ CAD
4 series · 4 of 12 positions shown · non-contrast
Comparison: Prior films

CLINICAL DATA: Callback from screening mammogram for possible mass
left breast

EXAM:
DIGITAL DIAGNOSTIC UNILATERAL LEFT MAMMOGRAM WITH TOMOSYNTHESIS AND
CAD
TECHNIQUE: Left digital diagnostic mammography and breast tomosynthesis was
performed. The images were evaluated with computer-aided detection.

[L MLO synth-2D]
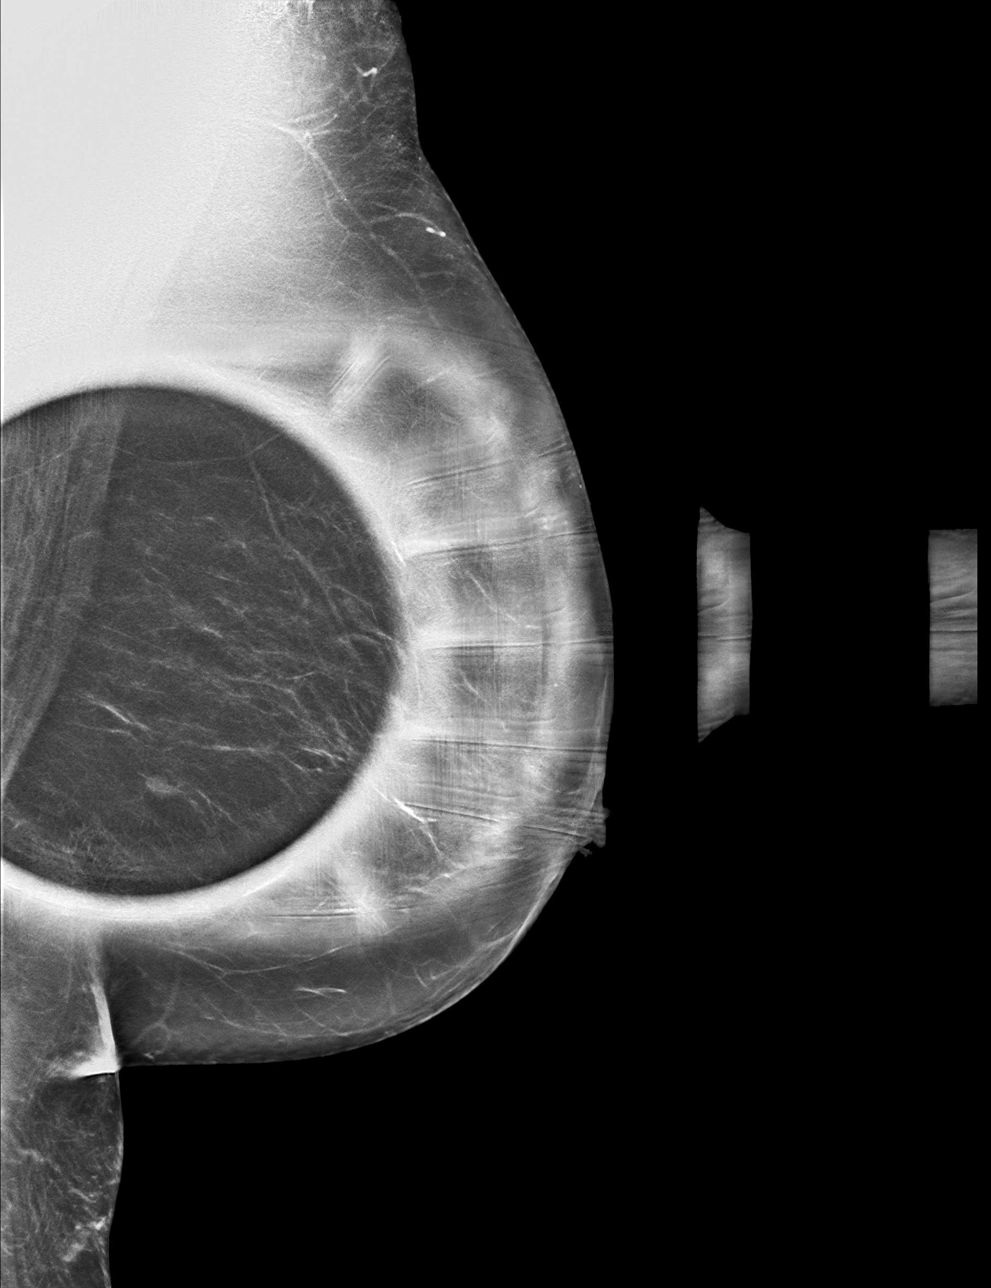

[L CC synth-2D]
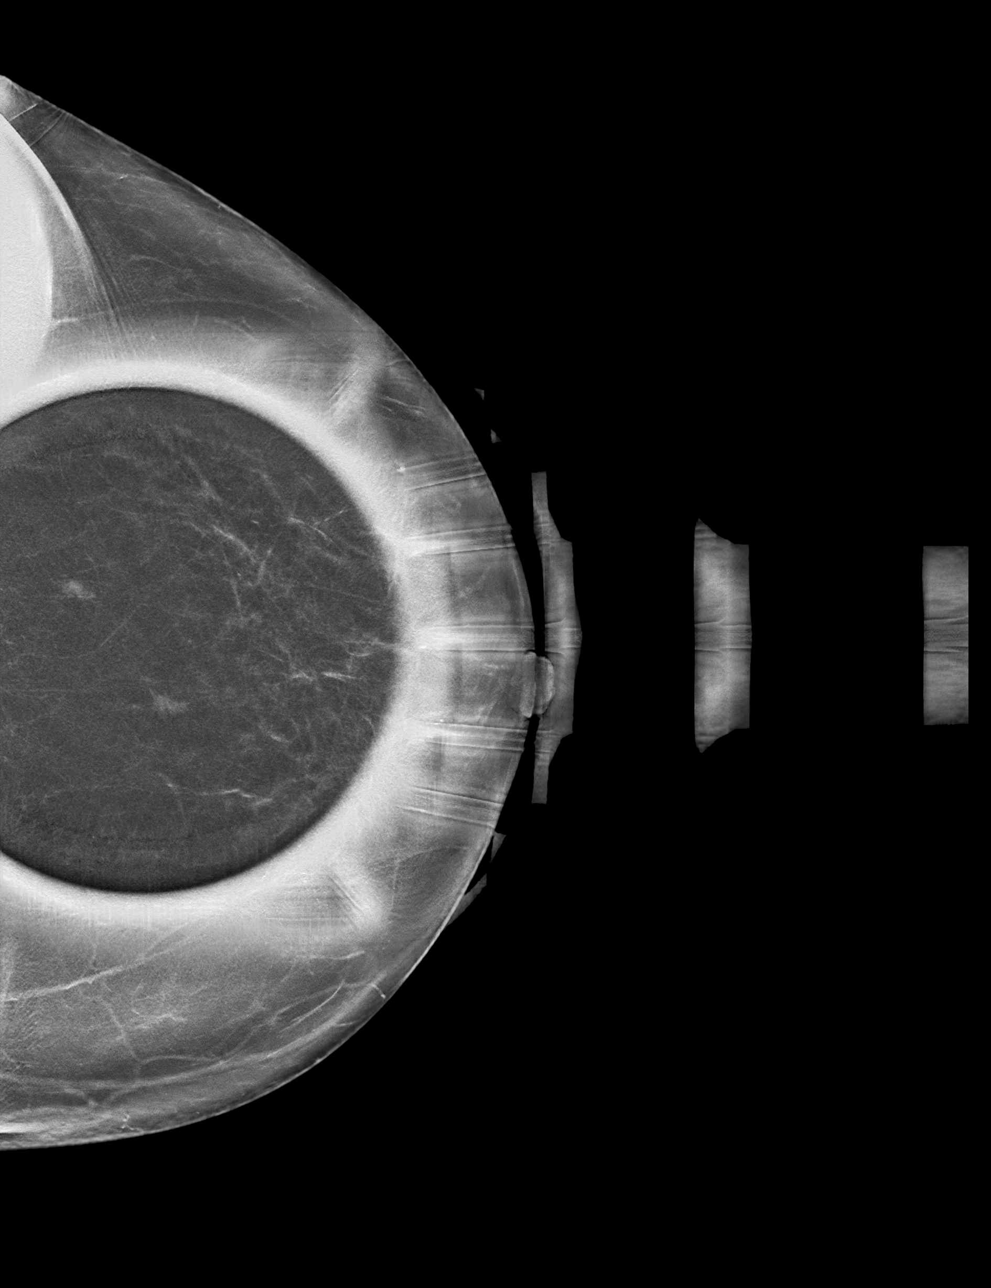

[L MLO tomo · tomo slice 33/64.0]
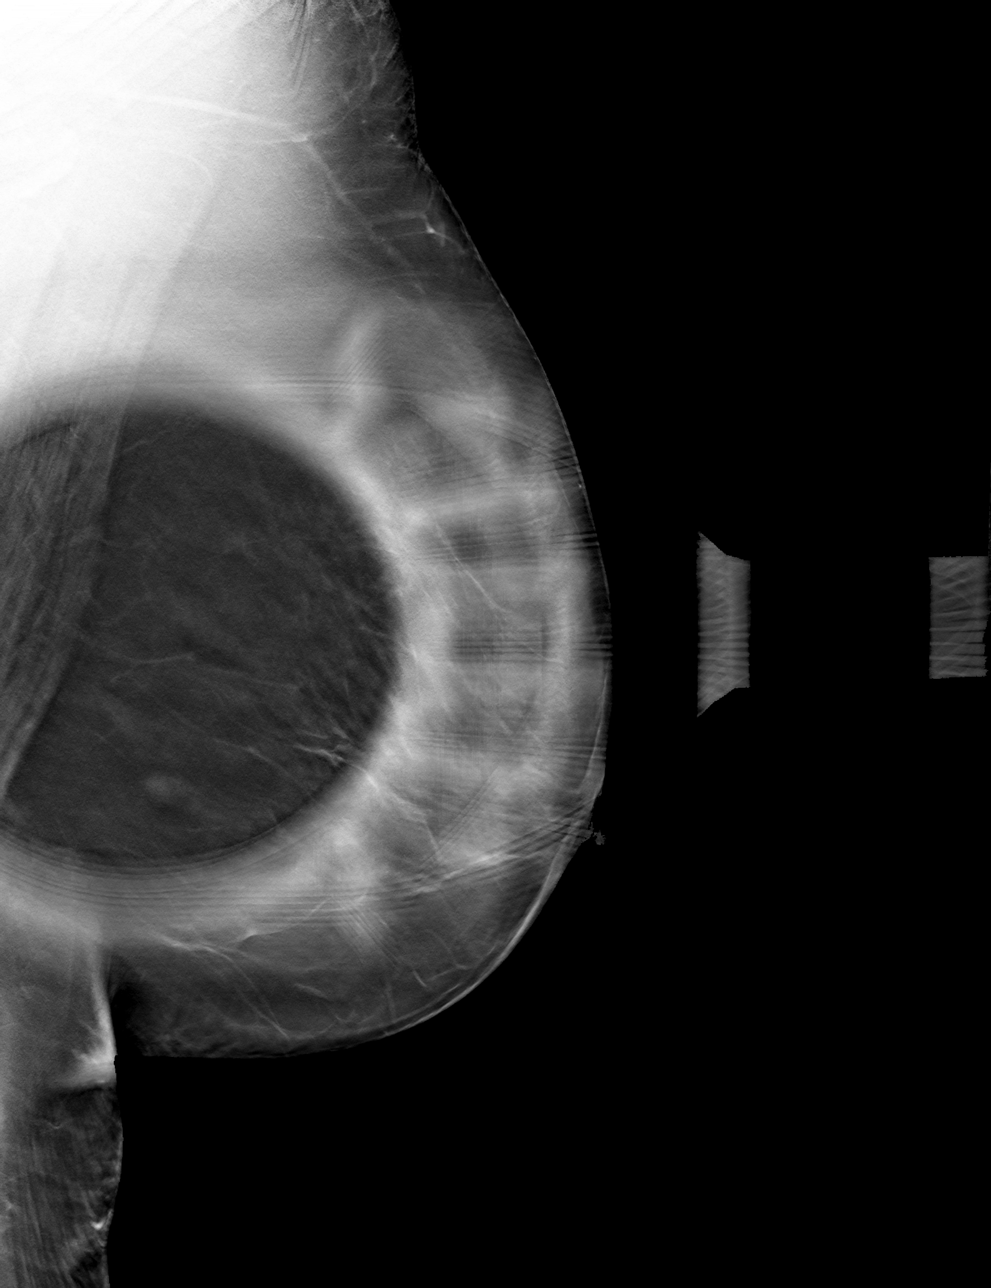

[L CC tomo · tomo slice 31/60.0]
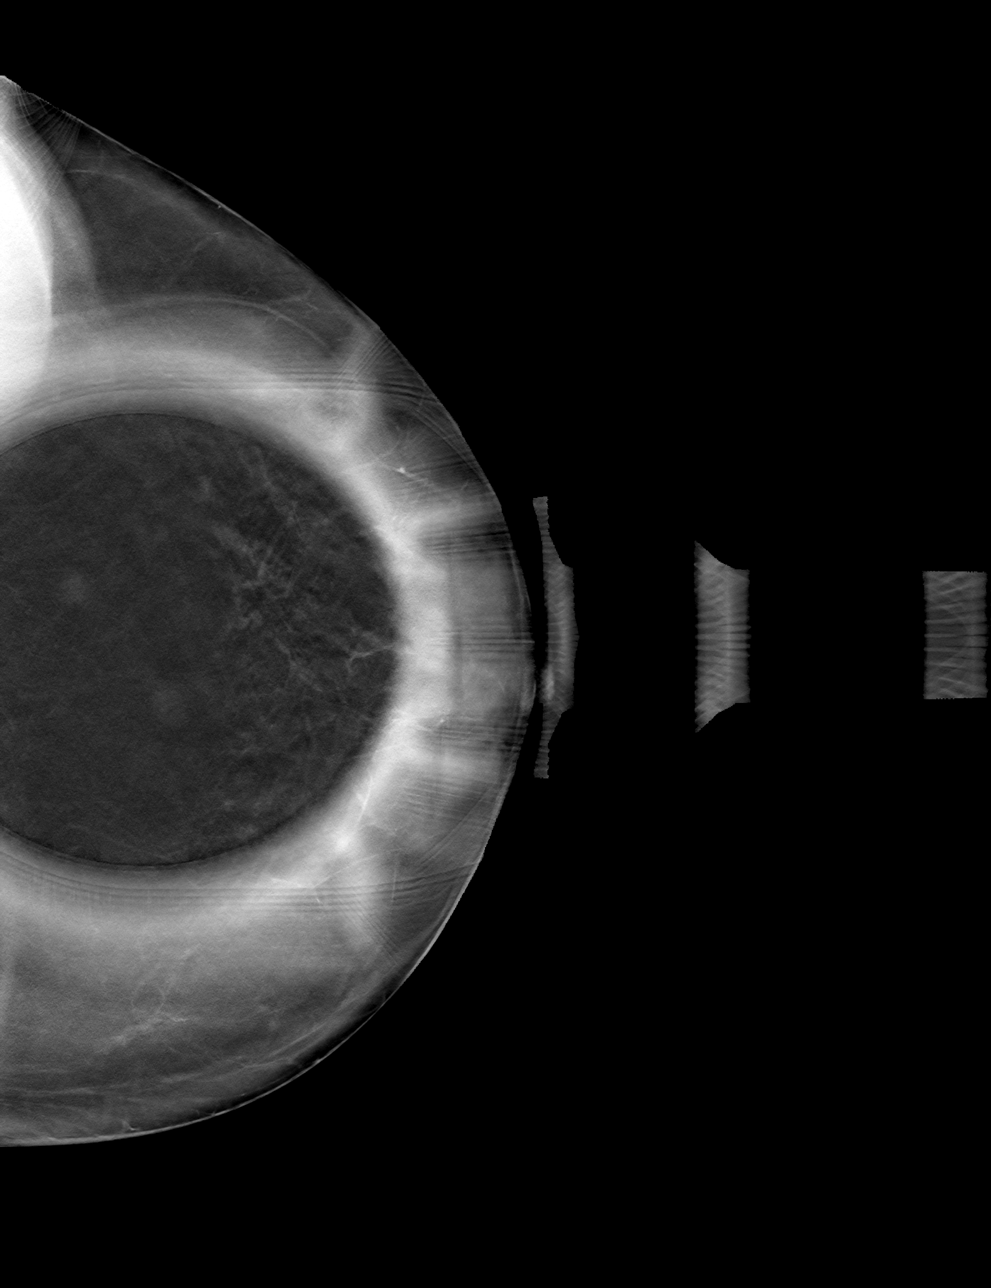

[4 of 12 positions shown; findings below may reference images not displayed]

ACR Breast Density Category b: There are scattered areas of
fibroglandular density.
FINDINGS: Spot compression cc and MLO views left breast are submitted. There
are 2 central posterior asymmetries that are stable compared prior
exam of 3005.
IMPRESSION: Benign findings.

RECOMMENDATION:
Routine screening mammogram back on schedule.

I have discussed the findings and recommendations with the patient.
If applicable, a reminder letter will be sent to the patient
regarding the next appointment.

BI-RADS CATEGORY  2: Benign.

## 2022-11-13 ENCOUNTER — Encounter: Payer: Self-pay | Admitting: Emergency Medicine

## 2022-11-13 ENCOUNTER — Ambulatory Visit
Admission: EM | Admit: 2022-11-13 | Discharge: 2022-11-13 | Disposition: A | Discharge status: Home / Self Care | Payer: Medicare Other | Attending: Family Medicine | Admitting: Family Medicine

## 2022-11-13 DIAGNOSIS — J441 Chronic obstructive pulmonary disease with (acute) exacerbation: Secondary | ICD-10-CM | POA: Diagnosis not present

## 2022-11-13 MED ORDER — PREDNISONE 50 MG PO TABS: 50.0000 mg | ORAL_TABLET | Freq: Every day | ORAL | 0 refills | Status: AC

## 2022-11-13 NOTE — ED Triage Notes (Signed)
Pt c/o shortness of breath. Started about 2 weeks ago. She states she was out of her regular inhalers but has refilled for them now. She states prednisone usually helps when she feels this.
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
## 2022-11-13 NOTE — Discharge Instructions (Addendum)
Stop by the pharmacy to pick up your prescriptions.  Follow up with your primary care provider as needed.  Go to the emergency department if your breathing worsens.

## 2023-03-14 ENCOUNTER — Other Ambulatory Visit: Payer: Self-pay

## 2023-03-14 DIAGNOSIS — Z87891 Personal history of nicotine dependence: Secondary | ICD-10-CM

## 2023-03-14 DIAGNOSIS — F17210 Nicotine dependence, cigarettes, uncomplicated: Secondary | ICD-10-CM

## 2023-04-23 ENCOUNTER — Encounter: Payer: Self-pay | Admitting: Acute Care

## 2023-04-23 ENCOUNTER — Ambulatory Visit (INDEPENDENT_AMBULATORY_CARE_PROVIDER_SITE_OTHER): Payer: Medicare PPO | Admitting: Acute Care

## 2023-04-23 DIAGNOSIS — F17210 Nicotine dependence, cigarettes, uncomplicated: Secondary | ICD-10-CM | POA: Diagnosis not present

## 2023-04-23 NOTE — Patient Instructions (Signed)

## 2023-04-23 NOTE — Progress Notes (Signed)
Virtual Visit via Telephone Note  I connected with Sandrea Hughs on 04/23/23 at  3:00 PM EDT by telephone and verified that I am speaking with the correct person using two identifiers.  Location: Patient:  At home Provider:  [address] [place], [place], [address]    I discussed the limitations, risks, security and privacy concerns of performing an evaluation and management service by telephone and the availability of in person appointments. I also discussed with the patient that there may be a patient responsible charge related to this service. The patient expressed understanding and agreed to proceed.    Shared Decision Making Visit Lung Cancer Screening Program [phone])   Eligibility: Age 62 y.o. Pack Years Smoking History Calculation 47 pack year smoking history (# packs/per year x # years smoked) Recent History of coughing up blood  no Unexplained weight loss? no ( >Than 15 pounds within the last 6 months ) Prior History Lung / other cancer no (Diagnosis within the last 5 years already requiring surveillance chest CT Scans). Smoking Status Current Smoker Former Smokers: Years since quit:  NA  Quit Date:  NA  Visit Components: Discussion included one or more decision making aids. yes Discussion included risk/benefits of screening. yes Discussion included potential follow up diagnostic testing for abnormal scans. yes Discussion included meaning and risk of over diagnosis. yes Discussion included meaning and risk of False Positives. yes Discussion included meaning of total radiation exposure. yes  Counseling Included: Importance of adherence to annual lung cancer LDCT screening. yes Impact of comorbidities on ability to participate in the program. yes Ability and willingness to under diagnostic treatment. yes  Smoking Cessation Counseling: Current Smokers:  Discussed importance of smoking cessation. yes Information about tobacco cessation classes and  interventions provided to patient. yes Patient provided with "ticket" for LDCT Scan. yes Symptomatic Patient. no  Counseling NA Diagnosis Code: Tobacco Use Z72.0 Asymptomatic Patient yes  Counseling (Intermediate counseling: > three minutes counseling) [id number] Former Smokers:  Discussed the importance of maintaining cigarette abstinence. yes Diagnosis Code: Personal History of Nicotine Dependence. U98.119 Information about tobacco cessation classes and interventions provided to patient. Yes Patient provided with "ticket" for LDCT Scan. yes Written Order for Lung Cancer Screening with LDCT placed in Epic. Yes (CT Chest Lung Cancer Screening Low Dose W/O CM) [id number] Z12.2-Screening of respiratory organs Z87.891-Personal history of nicotine dependence  I have spent 25 minutes of face to face/ virtual visit   time with  Ms. Dahmer  discussing the risks and benefits of lung cancer screening. We viewed / discussed a power point together that explained in detail the above noted topics. We paused at intervals to allow for questions to be asked and answered to ensure understanding.We discussed that the single most powerful action that she can take to decrease her risk of developing lung cancer is to quit smoking. We discussed whether or not she is ready to commit to setting a quit date. We discussed options for tools to aid in quitting smoking including nicotine replacement therapy, non-nicotine medications, support groups, Quit Smart classes, and behavior modification. We discussed that often times setting smaller, more achievable goals, such as eliminating 1 cigarette a day for a week and then 2 cigarettes a day for a week can be helpful in slowly decreasing the number of cigarettes smoked. This allows for a sense of accomplishment as well as providing a clinical benefit. I provided  her  with smoking cessation  information  with contact information for  community resources, classes, free nicotine replacement  therapy, and access to mobile apps, text messaging, and on-line smoking cessation help. I have also provided  her  the office contact information in the event she needs to contact me, or the screening staff. We discussed the time and location of the scan, and that either Abigail Miyamoto RN, Karlton Lemon, RN  or I will call / send a letter with the results within 24-72 hours of receiving them. The patient verbalized understanding of all of  the above and had no further questions upon leaving the office. They have my contact information in the event they have any further questions.  I spent 3 minutes counseling on smoking cessation and the health risks of continued tobacco abuse.  I explained to the patient that there has been a high incidence of coronary artery disease noted on these exams. I explained that this is a non-gated exam therefore degree or severity cannot be determined. This patient is on statin therapy. I have asked the patient to follow-up with their PCP regarding any incidental finding of coronary artery disease and management with diet or medication as their PCP  feels is clinically indicated. The patient verbalized understanding of the above and had no further questions upon completion of the visit.      Bevelyn Ngo, NP 04/23/2023

## 2023-04-24 ENCOUNTER — Ambulatory Visit: Payer: Medicare PPO

## 2023-05-03 ENCOUNTER — Ambulatory Visit
Admission: RE | Admit: 2023-05-03 | Discharge: 2023-05-03 | Disposition: A | Discharge status: Home / Self Care | Payer: Medicare PPO | Source: Ambulatory Visit | Attending: Acute Care | Admitting: Acute Care

## 2023-05-03 DIAGNOSIS — F17210 Nicotine dependence, cigarettes, uncomplicated: Secondary | ICD-10-CM | POA: Insufficient documentation

## 2023-05-03 DIAGNOSIS — Z87891 Personal history of nicotine dependence: Secondary | ICD-10-CM | POA: Insufficient documentation

## 2023-05-09 ENCOUNTER — Telehealth: Payer: Self-pay | Admitting: Acute Care

## 2023-05-09 DIAGNOSIS — Z87891 Personal history of nicotine dependence: Secondary | ICD-10-CM

## 2023-05-09 DIAGNOSIS — R911 Solitary pulmonary nodule: Secondary | ICD-10-CM

## 2023-05-09 NOTE — Telephone Encounter (Signed)
Tiffany w/[facility] calling. Tsf to [place].

## 2023-05-09 NOTE — Telephone Encounter (Signed)
Spoke with pt and advised of CT results. Small nodules seen on scan that we would like to look at again in 3 months with a repeat CT scan. Pt verbalized understanding and is aware we will call her closer to 3 months to schedule. CT results faxed to PCP with follow up plans included. Order placed for 3 mth nodule f/u CT.

## 2023-05-09 NOTE — Telephone Encounter (Signed)
Spoke with Tiffany with [facility] regarding call report for pt's LDCT from 05/03/23. Results reveal a Lung-RADS 4A. Impression below.     IMPRESSION: 1. Right lower lobe pulmonary nodule categorized as Lung-RADS 4A, suspicious. Follow up low-dose chest CT without contrast in 3 months (please use the following order, "CT CHEST LCS NODULE FOLLOW-UP W/O CM") is recommended. Alternatively, PET may be considered when there is a solid component 8mm or larger. 2. There is aortic atherosclerosis, in addition to left main and three-vessel coronary artery disease. Status post median sternotomy for CABG including LIMA to the LAD. 3. Mild diffuse bronchial wall thickening with mild centrilobular and paraseptal emphysema; imaging findings suggestive of underlying COPD.   These results will be called to the ordering clinician or representative by the Radiologist Assistant, and communication documented in the PACS or [employer].   Aortic Atherosclerosis (ICD10-I70.0) and Emphysema (ICD10-J43.9).     Electronically Signed   By: Trudie Reed M.D.   On: 05/09/2023 08:22

## 2023-05-09 NOTE — Telephone Encounter (Signed)
I have attempted to call the patient with the results of their  Low Dose CT Chest Lung cancer screening scan. There was no answer. I have left a HIPPA compliant VM requesting the patient call the office for the scan results. I included the office contact information in the message. We will await their return call. If no return call we will continue to call until patient is contacted.    Pt. Needs a 3 month follow up screening CT Chest. Her scan was read as a 4A. There are Multiple small pulmonary nodules are noted, largest of which is in the right lower lobe (axial image 188) where there is a slightly ill-defined nodule with a volume derived mean diameter of 9.9 mm. Follow up scan will be due after 08/03/2023.  Please fax PCP and let them know plan is for follow up in 3 months once we have contacted the patient. . Thanks so much

## 2023-05-13 ENCOUNTER — Other Ambulatory Visit: Payer: Self-pay

## 2023-08-05 ENCOUNTER — Ambulatory Visit: Admission: RE | Admit: 2023-08-05 | Payer: Medicare PPO | Source: Ambulatory Visit

## 2023-08-07 ENCOUNTER — Ambulatory Visit
Admission: RE | Admit: 2023-08-07 | Discharge: 2023-08-07 | Disposition: A | Discharge status: Home / Self Care | Payer: Medicare PPO | Source: Ambulatory Visit | Attending: Acute Care | Admitting: Acute Care

## 2023-08-07 DIAGNOSIS — Z87891 Personal history of nicotine dependence: Secondary | ICD-10-CM | POA: Diagnosis present

## 2023-08-07 DIAGNOSIS — R911 Solitary pulmonary nodule: Secondary | ICD-10-CM | POA: Insufficient documentation

## 2023-09-02 ENCOUNTER — Telehealth: Payer: Self-pay | Admitting: Acute Care

## 2023-09-02 NOTE — Telephone Encounter (Signed)
Left VM for patient to call for review of LDCT results.  LR3 from previous 4A.

## 2023-09-06 ENCOUNTER — Telehealth: Payer: Self-pay | Admitting: Acute Care

## 2023-09-06 DIAGNOSIS — R911 Solitary pulmonary nodule: Secondary | ICD-10-CM

## 2023-09-06 NOTE — Telephone Encounter (Signed)
This scan wet form a LR 4A to a 3, the nodule has shrunk, so ok to do the 6 month follow up, if you all can call her. Thanks so much. Please fax results to PCP and let them know plan for follow up.

## 2023-09-09 NOTE — Telephone Encounter (Signed)
Unable to reach by phone. Letter sent to [facility]

## 2023-09-13 NOTE — Telephone Encounter (Signed)
Called and left VM for pt

## 2023-09-19 NOTE — Addendum Note (Signed)
Addended by: Sheran Luz on: 09/19/2023 02:17 PM   Modules accepted: Orders

## 2023-09-19 NOTE — Telephone Encounter (Signed)
Called and spoke to pt. Informed her of the results and recs per Maralyn Sago. Patient understands the nodule has shrunk but would be best to still do short term follow up to keep an eye on it. Order placed. Pt verbalized understanding and denied any further questions or concerns at this time.

## 2024-03-23 ENCOUNTER — Other Ambulatory Visit: Payer: Self-pay | Admitting: Specialist

## 2024-03-23 DIAGNOSIS — R911 Solitary pulmonary nodule: Secondary | ICD-10-CM

## 2024-04-02 ENCOUNTER — Ambulatory Visit
Admission: RE | Admit: 2024-04-02 | Discharge: 2024-04-02 | Disposition: A | Discharge status: Home / Self Care | Source: Ambulatory Visit | Attending: Specialist | Admitting: Specialist

## 2024-04-02 DIAGNOSIS — R911 Solitary pulmonary nodule: Secondary | ICD-10-CM | POA: Diagnosis present

## 2024-04-07 ENCOUNTER — Telehealth: Payer: Self-pay

## 2024-04-07 NOTE — Telephone Encounter (Signed)
 Yes, the LDCT ordered by Lauraine is not needed at this time. Will monitor results of LDCT ordered by Dr. Theotis.

## 2024-04-07 NOTE — Telephone Encounter (Signed)
 I called the patient to scheduled, but it looks like pt completed a LCS Chest CT on 04/02/24 ordered by Dr. Theotis.  Should we cancel the one ordered by Lauraine?

## 2024-04-13 ENCOUNTER — Telehealth: Payer: Self-pay

## 2024-04-13 NOTE — Telephone Encounter (Signed)
 CT f/u scan completed with Dr. Theotis, resulted 04/09/2024 as a Rad 2. Will f/u in 2026 for annual scan . Reminder set.
